# Patient Record
Sex: Male | Born: 1945 | Race: White | Hispanic: No | Marital: Married | Smoking: Never smoker
Health system: Southern US, Community
[De-identification: ages and names within clinical notes are randomized; demographics above are authoritative.]

## PROBLEM LIST (undated history)

## (undated) DIAGNOSIS — T7840XA Allergy, unspecified, initial encounter: Secondary | ICD-10-CM

## (undated) DIAGNOSIS — D696 Thrombocytopenia, unspecified: Secondary | ICD-10-CM

## (undated) DIAGNOSIS — L409 Psoriasis, unspecified: Secondary | ICD-10-CM

## (undated) DIAGNOSIS — E785 Hyperlipidemia, unspecified: Secondary | ICD-10-CM

## (undated) HISTORY — DX: Gilbert syndrome: E80.4

## (undated) HISTORY — DX: Hyperlipidemia, unspecified: E78.5

## (undated) HISTORY — PX: TONSILLECTOMY AND ADENOIDECTOMY: SUR1326

## (undated) HISTORY — DX: Psoriasis, unspecified: L40.9

## (undated) HISTORY — DX: Thrombocytopenia, unspecified: D69.6

## (undated) HISTORY — DX: Allergy, unspecified, initial encounter: T78.40XA

## (undated) HISTORY — PX: COLONOSCOPY: SHX174

---

## 2003-06-01 ENCOUNTER — Encounter: Payer: Self-pay | Admitting: Internal Medicine

## 2004-06-16 ENCOUNTER — Ambulatory Visit: Payer: Self-pay | Admitting: Internal Medicine

## 2004-06-23 ENCOUNTER — Ambulatory Visit: Payer: Self-pay | Admitting: Internal Medicine

## 2004-07-12 ENCOUNTER — Ambulatory Visit: Payer: Self-pay | Admitting: Internal Medicine

## 2005-01-18 ENCOUNTER — Ambulatory Visit: Payer: Self-pay | Admitting: Internal Medicine

## 2006-03-04 ENCOUNTER — Ambulatory Visit: Payer: Self-pay | Admitting: Internal Medicine

## 2006-03-04 ENCOUNTER — Encounter (INDEPENDENT_AMBULATORY_CARE_PROVIDER_SITE_OTHER): Payer: Self-pay | Admitting: *Deleted

## 2006-03-04 LAB — CONVERTED CEMR LAB: PSA: 3.31 ng/mL

## 2006-03-18 ENCOUNTER — Ambulatory Visit: Payer: Self-pay | Admitting: Internal Medicine

## 2006-07-03 ENCOUNTER — Ambulatory Visit: Payer: Self-pay | Admitting: Internal Medicine

## 2006-07-03 LAB — CONVERTED CEMR LAB
AST: 23 units/L (ref 0–37)
Cholesterol: 132 mg/dL (ref 0–200)
LDL Cholesterol: 80 mg/dL (ref 0–99)

## 2007-01-08 ENCOUNTER — Ambulatory Visit: Payer: Self-pay | Admitting: Internal Medicine

## 2007-01-10 ENCOUNTER — Ambulatory Visit: Payer: Self-pay | Admitting: Internal Medicine

## 2007-01-10 DIAGNOSIS — J45909 Unspecified asthma, uncomplicated: Secondary | ICD-10-CM | POA: Insufficient documentation

## 2007-01-10 DIAGNOSIS — E785 Hyperlipidemia, unspecified: Secondary | ICD-10-CM | POA: Insufficient documentation

## 2007-01-10 DIAGNOSIS — L408 Other psoriasis: Secondary | ICD-10-CM

## 2007-01-10 LAB — CONVERTED CEMR LAB
AST: 23 units/L (ref 0–37)
Cholesterol: 129 mg/dL (ref 0–200)
Total CHOL/HDL Ratio: 3.1

## 2007-01-14 ENCOUNTER — Ambulatory Visit: Payer: Self-pay | Admitting: Gastroenterology

## 2007-02-12 ENCOUNTER — Ambulatory Visit: Payer: Self-pay | Admitting: Gastroenterology

## 2007-02-21 ENCOUNTER — Encounter: Payer: Self-pay | Admitting: Internal Medicine

## 2007-02-21 ENCOUNTER — Ambulatory Visit: Payer: Self-pay | Admitting: Gastroenterology

## 2007-04-07 ENCOUNTER — Telehealth (INDEPENDENT_AMBULATORY_CARE_PROVIDER_SITE_OTHER): Payer: Self-pay | Admitting: *Deleted

## 2007-05-19 ENCOUNTER — Telehealth (INDEPENDENT_AMBULATORY_CARE_PROVIDER_SITE_OTHER): Payer: Self-pay | Admitting: *Deleted

## 2007-06-30 ENCOUNTER — Encounter (INDEPENDENT_AMBULATORY_CARE_PROVIDER_SITE_OTHER): Payer: Self-pay | Admitting: *Deleted

## 2007-07-02 ENCOUNTER — Ambulatory Visit: Payer: Self-pay | Admitting: Internal Medicine

## 2007-07-02 DIAGNOSIS — R7989 Other specified abnormal findings of blood chemistry: Secondary | ICD-10-CM | POA: Insufficient documentation

## 2007-07-02 DIAGNOSIS — J309 Allergic rhinitis, unspecified: Secondary | ICD-10-CM | POA: Insufficient documentation

## 2007-07-14 ENCOUNTER — Encounter (INDEPENDENT_AMBULATORY_CARE_PROVIDER_SITE_OTHER): Payer: Self-pay | Admitting: *Deleted

## 2007-07-31 ENCOUNTER — Ambulatory Visit: Payer: Self-pay | Admitting: Internal Medicine

## 2007-10-30 ENCOUNTER — Telehealth (INDEPENDENT_AMBULATORY_CARE_PROVIDER_SITE_OTHER): Payer: Self-pay | Admitting: *Deleted

## 2008-01-09 ENCOUNTER — Telehealth (INDEPENDENT_AMBULATORY_CARE_PROVIDER_SITE_OTHER): Payer: Self-pay | Admitting: *Deleted

## 2008-03-25 ENCOUNTER — Telehealth (INDEPENDENT_AMBULATORY_CARE_PROVIDER_SITE_OTHER): Payer: Self-pay | Admitting: *Deleted

## 2008-04-20 ENCOUNTER — Telehealth (INDEPENDENT_AMBULATORY_CARE_PROVIDER_SITE_OTHER): Payer: Self-pay | Admitting: *Deleted

## 2008-07-05 ENCOUNTER — Ambulatory Visit: Payer: Self-pay | Admitting: Internal Medicine

## 2008-07-05 DIAGNOSIS — N400 Enlarged prostate without lower urinary tract symptoms: Secondary | ICD-10-CM

## 2008-07-12 ENCOUNTER — Encounter (INDEPENDENT_AMBULATORY_CARE_PROVIDER_SITE_OTHER): Payer: Self-pay | Admitting: *Deleted

## 2008-07-16 ENCOUNTER — Telehealth (INDEPENDENT_AMBULATORY_CARE_PROVIDER_SITE_OTHER): Payer: Self-pay | Admitting: *Deleted

## 2008-12-23 ENCOUNTER — Ambulatory Visit: Payer: Self-pay | Admitting: Internal Medicine

## 2008-12-23 ENCOUNTER — Telehealth (INDEPENDENT_AMBULATORY_CARE_PROVIDER_SITE_OTHER): Payer: Self-pay | Admitting: *Deleted

## 2008-12-27 ENCOUNTER — Encounter: Payer: Self-pay | Admitting: Internal Medicine

## 2009-01-14 ENCOUNTER — Telehealth (INDEPENDENT_AMBULATORY_CARE_PROVIDER_SITE_OTHER): Payer: Self-pay | Admitting: *Deleted

## 2009-01-25 ENCOUNTER — Ambulatory Visit: Payer: Self-pay | Admitting: Internal Medicine

## 2009-01-25 LAB — CONVERTED CEMR LAB: LDL Goal: 115 mg/dL

## 2009-04-12 ENCOUNTER — Ambulatory Visit: Payer: Self-pay | Admitting: Internal Medicine

## 2009-04-20 LAB — CONVERTED CEMR LAB
Albumin: 4.4 g/dL (ref 3.5–5.2)
Cholesterol: 140 mg/dL (ref 0–200)
HDL: 42.7 mg/dL (ref >39.00)
Total CK: 59 units/L (ref 7–232)
Total Protein: 7.6 g/dL (ref 6.0–8.3)
Triglycerides: 61 mg/dL (ref 0.0–149.0)

## 2009-04-26 ENCOUNTER — Ambulatory Visit: Payer: Self-pay | Admitting: Internal Medicine

## 2009-06-04 HISTORY — PX: PROSTATE SURGERY: SHX751

## 2009-10-11 ENCOUNTER — Ambulatory Visit: Payer: Self-pay | Admitting: Internal Medicine

## 2009-10-11 DIAGNOSIS — R351 Nocturia: Secondary | ICD-10-CM

## 2009-10-11 DIAGNOSIS — Z9189 Other specified personal risk factors, not elsewhere classified: Secondary | ICD-10-CM

## 2009-10-19 ENCOUNTER — Telehealth (INDEPENDENT_AMBULATORY_CARE_PROVIDER_SITE_OTHER): Payer: Self-pay | Admitting: *Deleted

## 2009-10-19 DIAGNOSIS — R972 Elevated prostate specific antigen [PSA]: Secondary | ICD-10-CM | POA: Insufficient documentation

## 2009-11-21 ENCOUNTER — Encounter: Payer: Self-pay | Admitting: Internal Medicine

## 2009-12-15 ENCOUNTER — Encounter: Payer: Self-pay | Admitting: Internal Medicine

## 2010-07-02 LAB — CONVERTED CEMR LAB
ALT: 24 units/L (ref 0–53)
ALT: 30 units/L (ref 0–53)
AST: 25 units/L (ref 0–37)
AST: 26 units/L (ref 0–37)
Albumin: 4.5 g/dL (ref 3.5–5.2)
Alkaline Phosphatase: 48 units/L (ref 39–117)
BUN: 15 mg/dL (ref 6–23)
Basophils Absolute: 0 10*3/uL (ref 0.0–0.1)
Basophils Absolute: 0.1 10*3/uL (ref 0.0–0.1)
Basophils Relative: 1.2 % (ref 0.0–3.0)
Bilirubin, Direct: 0.3 mg/dL (ref 0.0–0.3)
CO2: 30 meq/L (ref 19–32)
Calcium: 9.5 mg/dL (ref 8.4–10.5)
Calcium: 9.7 mg/dL (ref 8.4–10.5)
Cholesterol: 150 mg/dL (ref 0–200)
Creatinine, Ser: 1.1 mg/dL (ref 0.4–1.5)
Eosinophils Absolute: 0.3 10*3/uL (ref 0.0–0.6)
Eosinophils Relative: 5.2 % — ABNORMAL HIGH (ref 0.0–5.0)
Eosinophils Relative: 5.5 % — ABNORMAL HIGH (ref 0.0–5.0)
Eosinophils Relative: 5.9 % — ABNORMAL HIGH (ref 0.0–5.0)
GFR calc Af Amer: 87 mL/min
GFR calc non Af Amer: 72 mL/min
GFR calc non Af Amer: 85.35 mL/min (ref >60)
Glucose, Bld: 100 mg/dL — ABNORMAL HIGH (ref 70–99)
Glucose, Bld: 92 mg/dL (ref 70–99)
Glucose, Bld: 97 mg/dL (ref 70–99)
HCT: 39.5 % (ref 39.0–52.0)
HCT: 41.3 % (ref 39.0–52.0)
HDL goal, serum: 40 mg/dL
HDL: 49 mg/dL (ref >39.00)
Hemoglobin: 13.8 g/dL (ref 13.0–17.0)
Hemoglobin: 14.5 g/dL (ref 13.0–17.0)
Hemoglobin: 14.6 g/dL (ref 13.0–17.0)
Hgb A1c MFr Bld: 5 % (ref 4.6–6.0)
LDL Cholesterol: 87 mg/dL (ref 0–99)
LDL Cholesterol: 94 mg/dL (ref 0–99)
LDL Goal: 160 mg/dL
Lymphocytes Relative: 18.6 % (ref 12.0–46.0)
Lymphocytes Relative: 18.8 % (ref 12.0–46.0)
Lymphocytes Relative: 20.4 % (ref 12.0–46.0)
Lymphs Abs: 1.1 10*3/uL (ref 0.7–4.0)
MCHC: 34.5 g/dL (ref 30.0–36.0)
MCV: 91.7 fL (ref 78.0–100.0)
MCV: 93.7 fL (ref 78.0–100.0)
Monocytes Absolute: 0.3 10*3/uL (ref 0.2–0.7)
Monocytes Relative: 9.2 % (ref 3.0–12.0)
Neutro Abs: 3 10*3/uL (ref 1.4–7.7)
Neutro Abs: 3.3 10*3/uL (ref 1.4–7.7)
Neutro Abs: 3.7 10*3/uL (ref 1.4–7.7)
Neutrophils Relative %: 69.6 % (ref 43.0–77.0)
PSA: 3.96 ng/mL (ref 0.10–4.00)
Platelets: 137 10*3/uL — ABNORMAL LOW (ref 150.0–400.0)
Potassium: 3.9 meq/L (ref 3.5–5.1)
RBC: 4.52 M/uL (ref 4.22–5.81)
RDW: 13.6 % (ref 11.5–14.6)
Sodium: 142 meq/L (ref 135–145)
Sodium: 144 meq/L (ref 135–145)
TSH: 1.35 microintl units/mL (ref 0.35–5.50)
Total Bilirubin: 1.1 mg/dL (ref 0.3–1.2)
Total Bilirubin: 1.3 mg/dL — ABNORMAL HIGH (ref 0.3–1.2)
Total Protein: 7.5 g/dL (ref 6.0–8.3)
Triglycerides: 47 mg/dL (ref 0–149)
VLDL: 15.6 mg/dL (ref 0.0–40.0)
WBC: 4.8 10*3/uL (ref 4.5–10.5)
WBC: 5 10*3/uL (ref 4.5–10.5)
WBC: 5.6 10*3/uL (ref 4.5–10.5)

## 2010-07-04 NOTE — Assessment & Plan Note (Signed)
Summary: emp//pt will be fasting//lch   Vital Signs:  Patient profile:   65 year old male Height:      71 inches Weight:      189.6 pounds BMI:     26.54 Temp:     98.5 degrees F oral Pulse rate:   64 / minute Resp:     14 per minute BP sitting:   112 / 80  (left arm) Cuff size:   large  Vitals Entered By: Shonna Chock (Oct 11, 2009 8:25 AM) CC: CPX with fasting labs and discuss fish oil (which type to take). Patient also has a question about Cortizone ointment vs cream Comments REVIEWED MED LIST, PATIENT AGREED DOSE AND INSTRUCTION CORRECT    CC:  CPX with fasting labs and discuss fish oil (which type to take). Patient also has a question about Cortizone ointment vs cream.  History of Present Illness: [name] is here for a wellness  physical through [facility]; he  is having occasional nocturia & he has active Psoriasis.Other risk factors & preventive health measures reviewed . All risks addressed but he does not take flu shot.See ROS , PMF , FH , Social History & physical exam  for documentation. He questions iron deficiency as he avoids red meat due to lipid issues.  Allergies (verified): No Known Drug Allergies  Past History:  Past Medical History: Asthma ,PMH of as child Hyperlipidemia: LDL 133(1566/296),TG 61,HDL 46; LDL goal = <115 TCP(decreased platelet count) 2006; psoriasis ; Gilbert's Syndrome; Allergic rhinitis  Past Surgical History: Tonsillectomy Colonoscopy negative (2003 & 2008)  Family History: Father: Parkinson's disease Mother: dementia, elevated  cholesterol                                               MGF : MI in 44s ; M uncle: shunt for ? hydocephalus; P aunt: HTN,DM; M uncle: Mental Health diagnosis  Social History: Diet: no red meat Retired Married Never Smoked Alcohol use-yes, rarely Regular exercise-yes; walking 4X/week 1.5 mpd  Review of Systems General:  Denies chills, fatigue, fever, loss of appetite, sleep disorder, sweats,  and weight loss. Eyes:  Denies blurring, double vision, and vision loss-both eyes. ENT:  Denies difficulty swallowing, hoarseness, nasal congestion, and sinus pressure. CV:  Denies chest pain or discomfort, difficulty breathing at night, difficulty breathing while lying down, leg cramps with exertion, lightheadness, near fainting, shortness of breath with exertion, swelling of feet, and swelling of hands. Resp:  Complains of excessive snoring; denies cough, hypersomnolence, morning headaches, and sputum productive; No apnea. No asthma symptoms. GI:  Denies abdominal pain, bloody stools, dark tarry stools, and indigestion. GU:  Denies discharge, dysuria, hematuria, and incontinence. MS:  Denies joint pain, joint redness, joint swelling, low back pain, mid back pain, and thoracic pain. Derm:  Complains of rash; denies changes in nail beds, dryness, and lesion(s). Neuro:  Denies brief paralysis, difficulty with concentration, disturbances in coordination, falling down, memory loss, numbness, poor balance, tingling, and weakness. Psych:  Denies anxiety, depression, easily angered, easily tearful, and irritability. Endo:  Denies cold intolerance, excessive hunger, excessive thirst, excessive urination, and heat intolerance. Heme:  Denies abnormal bruising and bleeding. Allergy:  Complains of itching eyes, seasonal allergies, and sneezing; Much improved ; generic Allegra as needed .  Physical Exam  General:  well-nourished; alert,appropriate and cooperative throughout examination Head:  Normocephalic and  atraumatic without obvious abnormalities.  Eyes:  No corneal or conjunctival inflammation noted.  Perrla. Funduscopic exam benign, without hemorrhages, exudates or papilledema. Vision grossly normal. Ears:  External ear exam shows no significant lesions or deformities.  Otoscopic examination reveals clear canals, tympanic membranes are intact bilaterally without bulging, retraction, inflammation or  discharge. Hearing is grossly normal bilaterally. Some wax bilaterally Nose:  External nasal examination shows no deformity or inflammation. Nasal mucosa are pink and moist without lesions or exudates. Mouth:  Oral mucosa and oropharynx without lesions or exudates.  Teeth in good repair. Neck:  No deformities, masses, or tenderness noted. Lungs:  Normal respiratory effort, chest expands symmetrically. Lungs are clear to auscultation, no crackles or wheezes. Heart:  Normal rate and regular rhythm. S1 and S2 normal without gallop, murmur, click, rub.S4 Abdomen:  Bowel sounds positive,abdomen soft and non-tender without masses, organomegaly. Ventral  hernia  noted. Rectal:  No external abnormalities noted. Normal sphincter tone. No rectal masses or tenderness. Genitalia:  Testes bilaterally descended without nodularity, tenderness or masses. No scrotal masses or lesions. No penis lesions or urethral discharge. Small  L varicocele.   Prostate:  Prostate gland firm and smooth, no enlargement, nodularity, tenderness, mass, asymmetry or induration. Msk:  No deformity or scoliosis noted of thoracic or lumbar spine.   Pulses:  R and L carotid,radial,dorsalis pedis and posterior tibial pulses are full and equal bilaterally Extremities:  No clubbing, cyanosis, edema, or deformity noted with normal full range of motion of all joints.   Neurologic:  alert & oriented X3, gait normal, and DTRs symmetrical and normal.   Skin:  Psoriatic patch L upper back Cervical Nodes:  No lymphadenopathy noted Axillary Nodes:  No palpable lymphadenopathy Inguinal Nodes:  No significant adenopathy Psych:  Oriented X3, memory intact for recent and remote, normally interactive, good eye contact, not anxious appearing, and not depressed appearing.     Impression & Recommendations:  Problem # 1:  PREVENTIVE HEALTH CARE (ICD-V70.0)  Orders: EKG w/ Interpretation (93000) Venipuncture (36644) TLB-Lipid Panel  (80061-LIPID) TLB-BMP (Basic Metabolic Panel-BMET) (80048-METABOL) TLB-CBC Platelet - w/Differential (85025-CBCD) TLB-Hepatic/Liver Function Pnl (80076-HEPATIC) TLB-TSH (Thyroid Stimulating Hormone) (84443-TSH) TLB-PSA (Prostate Specific Antigen) (84153-PSA)  Problem # 2:  NOCTURIA (ICD-788.43) intermittent ; decreased fluids pre  bedtime   Problem # 3:  SNORING, HX OF (ICD-V15.89) verify if apnea present; Sleep Study if symptoms progressive  Problem # 4:  HYPERPLASIA PROSTATE UNS W/O UR OBST & OTH LUTS (ICD-600.90) resolved  Problem # 5:  HYPERLIPIDEMIA (ICD-272.4)  His updated medication list for this problem includes:    Niacin Cr 1000 Mg Cr-tabs (Niacin) .Marland Kitchen... 1 by mouth once daily    Pravastatin Sodium 20 Mg Tabs (Pravastatin sodium) .Marland Kitchen... 1 at bedtime  Orders: EKG w/ Interpretation (93000)  Problem # 6:  ALLERGIC RHINITIS (ICD-477.9)  The following medications were removed from the medication list:    Allegra 180 Mg Tabs (Fexofenadine hcl) ..... Once daily as needed His updated medication list for this problem includes:    Loratadine 10 Mg Tabs (Loratadine) .Marland Kitchen... 1 once daily as needed allergies  Problem # 7:  PSORIASIS (ICD-696.1)  Complete Medication List: 1)  Folic Acid 1 Mg Tabs (Folic acid) .Marland Kitchen.. 1 by mouth qd 2)  Niacin Cr 1000 Mg Cr-tabs (Niacin) .Marland Kitchen.. 1 by mouth once daily 3)  Clobetasol Propionate 0.05 % Oint (clobetasol Propionate)  .... As needed 4)  Fish Oil 1200 Mg Caps (Omega-3 fatty acids) .... 2 qd 5)  Aspir-low 81 Mg  Tbec (Aspirin) .Marland Kitchen.. 1 by mouth once daily 6)  Cortizone-10 1 % Oint (Hydrocortisone) .... As needed 7)  Pravastatin Sodium 20 Mg Tabs (Pravastatin sodium) .Marland Kitchen.. 1 at bedtime 8)  Loratadine 10 Mg Tabs (Loratadine) .Marland Kitchen.. 1 once daily as needed allergies  Patient Instructions: 1)  Report  any apnea or severe snoring as per wife's observation as we discussed. Prescriptions: LORATADINE 10 MG TABS (LORATADINE) 1 once daily as needed allergies   #90 x 3   Entered and Authorized by:   Marga Melnick MD   Signed by:   Marga Melnick MD on 10/11/2009   Method used:   Print then Give to Patient   RxID:   [id number] CLOBETASOL PROPIONATE 0.05 %  OINT (CLOBETASOL PROPIONATE) as needed  #90 grams x 0   Entered and Authorized by:   Marga Melnick MD   Signed by:   Marga Melnick MD on 10/11/2009   Method used:   Historical   RxID:   [id number] PRAVASTATIN SODIUM 20 MG TABS (PRAVASTATIN SODIUM) 1 at bedtime  #90 x 3   Entered and Authorized by:   Marga Melnick MD   Signed by:   Marga Melnick MD on 10/11/2009   Method used:   Print then Give to Patient   RxID:   [id number]

## 2010-07-04 NOTE — Progress Notes (Signed)
Summary: referral  Phone Note Call from Patient   Caller: Patient Summary of Call: Pt called back re-lab result which indicated urology consult. Pt states that he does not have a preferred physician just who ever.Hopp suggest.ok to speak with wife. left message to call office to inform pt referral has been put in, awaiting appt info.............Marland KitchenFelecia Deloach CMA  Oct 19, 2009 11:50 AM   pt wife aware awaiting appt info...................Marland KitchenFelecia Deloach CMA  Oct 19, 2009 4:50 PM    New Problems: PSA, INCREASED (ICD-790.93)   New Problems: PSA, INCREASED (ICD-790.93)

## 2010-07-04 NOTE — Letter (Signed)
Summary: [facility]   Imported By: Lennie Odor 12/29/2009 09:22:09  _____________________________________________________________________  External Attachment:    Type:   Image     Comment:   External Document

## 2010-07-04 NOTE — Consult Note (Signed)
Summary: [facility]  [facility]   Imported By: Lanelle Bal 11/30/2009 10:01:00  _____________________________________________________________________  External Attachment:    Type:   Image     Comment:   External Document

## 2010-07-21 ENCOUNTER — Encounter: Payer: Self-pay | Admitting: Internal Medicine

## 2010-08-01 NOTE — Letter (Signed)
Summary: [facility]  [facility]   Imported By: Maryln Gottron 07/28/2010 14:02:31  _____________________________________________________________________  External Attachment:    Type:   Image     Comment:   External Document

## 2010-09-19 ENCOUNTER — Other Ambulatory Visit: Payer: Self-pay | Admitting: Internal Medicine

## 2010-10-12 ENCOUNTER — Encounter: Payer: Self-pay | Admitting: Internal Medicine

## 2010-10-13 ENCOUNTER — Other Ambulatory Visit: Payer: Self-pay | Admitting: Internal Medicine

## 2010-10-13 ENCOUNTER — Ambulatory Visit (INDEPENDENT_AMBULATORY_CARE_PROVIDER_SITE_OTHER): Payer: Medicare Other | Admitting: Internal Medicine

## 2010-10-13 ENCOUNTER — Encounter: Payer: Self-pay | Admitting: Internal Medicine

## 2010-10-13 DIAGNOSIS — J309 Allergic rhinitis, unspecified: Secondary | ICD-10-CM

## 2010-10-13 DIAGNOSIS — B079 Viral wart, unspecified: Secondary | ICD-10-CM

## 2010-10-13 DIAGNOSIS — R7309 Other abnormal glucose: Secondary | ICD-10-CM

## 2010-10-13 DIAGNOSIS — Z0100 Encounter for examination of eyes and vision without abnormal findings: Secondary | ICD-10-CM

## 2010-10-13 DIAGNOSIS — E785 Hyperlipidemia, unspecified: Secondary | ICD-10-CM

## 2010-10-13 DIAGNOSIS — Z0000 Encounter for general adult medical examination without abnormal findings: Secondary | ICD-10-CM

## 2010-10-13 DIAGNOSIS — R972 Elevated prostate specific antigen [PSA]: Secondary | ICD-10-CM

## 2010-10-13 DIAGNOSIS — D696 Thrombocytopenia, unspecified: Secondary | ICD-10-CM

## 2010-10-13 DIAGNOSIS — Z23 Encounter for immunization: Secondary | ICD-10-CM

## 2010-10-13 LAB — LIPID PANEL
HDL: 45.1 mg/dL (ref >39.00)
LDL Cholesterol: 94 mg/dL (ref 0–99)
Total CHOL/HDL Ratio: 3
Triglycerides: 61 mg/dL (ref 0.0–149.0)

## 2010-10-13 LAB — CBC WITH DIFFERENTIAL/PLATELET
Basophils Relative: 0.6 % (ref 0.0–3.0)
Eosinophils Relative: 5.8 % — ABNORMAL HIGH (ref 0.0–5.0)
Hemoglobin: 14.1 g/dL (ref 13.0–17.0)
MCV: 93.5 fl (ref 78.0–100.0)
Monocytes Absolute: 0.5 10*3/uL (ref 0.1–1.0)
Neutrophils Relative %: 57.1 % (ref 43.0–77.0)
RBC: 4.33 Mil/uL (ref 4.22–5.81)
WBC: 4.2 10*3/uL — ABNORMAL LOW (ref 4.5–10.5)

## 2010-10-13 LAB — HEPATIC FUNCTION PANEL
Albumin: 4.5 g/dL (ref 3.5–5.2)
Bilirubin, Direct: 0.2 mg/dL (ref 0.0–0.3)
Total Protein: 7.3 g/dL (ref 6.0–8.3)

## 2010-10-13 LAB — BASIC METABOLIC PANEL
Chloride: 103 mEq/L (ref 96–112)
Potassium: 4 mEq/L (ref 3.5–5.1)

## 2010-10-13 MED ORDER — PNEUMOCOCCAL VAC POLYVALENT 25 MCG/0.5ML IJ INJ
0.5000 mL | INJECTION | Freq: Once | INTRAMUSCULAR | Status: AC
  Administered 2010-10-13: 0.5 mL via INTRAMUSCULAR

## 2010-10-13 MED ORDER — FLUTICASONE PROPIONATE 50 MCG/ACT NA SUSP: 1.0000 | Freq: Two times a day (BID) | NASAL | Status: AC | PRN

## 2010-10-13 NOTE — Assessment & Plan Note (Signed)
NMR Lipoprofile 2004: LDL 161(0960/454), HDL 30, TG 95. LDL goal = < 120. PGF MI > 55.

## 2010-10-13 NOTE — Progress Notes (Signed)
Subjective:    Patient ID: Antonio Steele, male    DOB: September 17, 1945, 65 y.o.   MRN: [id number]  HPI Medicare Wellness Visit:  The following psychosocial & medical history were reviewed as required by Medicare.   Social history: caffeine: 2 cups/ day , alcohol:  no ,  tobacco use : 1962( smoked 6 mos)  & exercise : walking / golf 3- 5 X/ week.   Home & personal  safety / fall risk: no issues, activities of daily living: no limitations , seatbelt use : yes , and smoke alarm employment :  yes .  Power of Attorney/Living Will status : yes  Vision ( as recorded per Nurse) & Hearing  evaluation :  Whisper heard @ 6 ft. Orientation :oriented x3 , memory & recall :good, spelling  good,and mood & affect :  normal . Depression / anxiety: denied Travel history : 72s [place] , immunization status :Pneumovax given , transfusion history:  no, and preventive health surveillance ( colonoscopies, BMD , etc as per protocol/ SOC): up to date, Dental care:  Sen every 6 mos . Chart reviewed &  Updated. Active issues reviewed & addressed.       Review of Systems Patient reports no  vision/ hearing changes,anorexia, weight change, fever ,adenopathy, persistant / recurrent hoarseness, swallowing issues, chest pain,palpitations, edema,persistant / recurrent cough, hemoptysis, dyspnea(rest, exertional, paroxysmal nocturnal), gastrointestinal  bleeding (melena, rectal bleeding), abdominal pain, excessive heart burn, GU symptoms( dysuria, hematuria, pyuria, voiding/incontinence  issues) syncope, focal weakness, memory loss,numbness & tingling, hair/nail changes, abnormal bruising/bleeding, musculoskeletal symptoms/signs.  Psoriasis controlled with meds. Seasonal allergies responsive to Allegra , not controlled with Loratidine.     Objective:   Physical Exam Gen.: Healthy and well-nourished in appearance. Alert, appropriate and cooperative throughout exam. Head: Normocephalic without obvious abnormalities;   Pattern   alopecia  Eyes: No corneal or conjunctival inflammation noted. Pupils equal round reactive to light and accommodation. Fundal exam is benign without hemorrhages, exudate, papilledema. Extraocular motion intact. Wart like growth OD upper lid Ears: External  ear exam reveals no significant lesions or deformities. Canals:wax bilaterally , but hearing is grossly normal bilaterally. Nose: External nasal exam reveals no deformity or inflammation. Nasal mucosa are pink and moist. No lesions or exudates noted. Septum  slight diuslocation  Mouth: Oral mucosa and oropharynx reveal no lesions or exudates. Teeth in good repair. Neck: No deformities, masses, or tenderness noted. Range of motion  normal. Thyroid   No nodules. Lungs: Normal respiratory effort; chest expands symmetrically. Lungs are clear to auscultation without rales, wheezes, or increased work of breathing. Heart: Normal rate and rhythm. Normal S1 and S2. No gallop, click, or rub.  S4 w/o  murmur. Abdomen: Bowel sounds normal; abdomen soft and nontender. No masses, organomegaly or hernias noted.Genitalia: Dr Retta Diones monitoring PSA  & DRE( seen every 6 months).                                         Musculoskeletal/extremities: No deformity or scoliosis noted of  the thoracic or lumbar spine. No clubbing, cyanosis, edema, or deformity noted. Range of motion  normal .Tone & strength  normal.Joints normal. Nail health  good. Vascular: Carotid, radial artery, dorsalis pedis and dorsalis posterior tibial pulses are full and equal. No bruits present. Neurologic: Alert and oriented x3. Deep tendon reflexes symmetrical and normal.  Skin: Intact without suspicious lesions or rashes. see  Eye; scattered keratoses Lymph: No cervical, axillary, or inguinal lymphadenopathy present. Psych: Mood and affect are normal. Normally interactive                                                                                         Assessment & Plan:  #1  Medicare wellness visit; criteria met and data  entered  #2 seasonal allergies; continue Allegra. Fluticasone will be added.  #3 dyslipidemia  Plan: See orders and recommendations.

## 2010-10-13 NOTE — Assessment & Plan Note (Signed)
PSA 8.32 on 07/17/2010; Dr Retta Diones

## 2010-10-13 NOTE — Patient Instructions (Signed)
Preventive Health Care: Exercise at least 30-45 minutes a day,  3-4 days a week.  Eat a low-fat diet with lots of fruits and vegetables, up to 7-9 servings per day. Avoid obesity; your goal is waist measurement < 40 inches.Consume less than 40 grams of sugar per day from foods & drinks with High Fructose Corn Sugar as #2,3 or # 4 on label. Use Allegra & Neti pot daily as needed.Generic  Flonase daily as Rxed .

## 2010-10-15 ENCOUNTER — Encounter: Payer: Self-pay | Admitting: Internal Medicine

## 2010-10-17 NOTE — Letter (Signed)
February 12, 2007    Titus Dubin. Alwyn Ren, MD,FACP,FCCP  [phone] [place], [place] [address]   RE:  Antonio Steele, Antonio Steele  MRN:  [id number]  /  DOB:  11-Dec-1945   Dear Dr. Alwyn Ren:   Upon your kind referral, I had the pleasure of evaluating your patient  and I am pleased to offer my findings.  I saw Antonio Steele in the  office today.  Enclosed is a copy of my progress note that details my  findings and recommendations.   Thank you for the opportunity to participate in your patient's care.    Sincerely,      Barbette Hair. Arlyce Dice, MD,FACG  Electronically Signed    RDK/MedQ  DD: 02/12/2007  DT: 02/13/2007  Job #: [id number]

## 2010-10-17 NOTE — Letter (Signed)
January 10, 2007    Barbette Hair. Arlyce Dice, MD,FACG  [address]  [place], [place] [address]   RE:  Antonio Steele, Antonio Steele  MRN:  [id number]  /  DOB:  1945/06/27   Dear Cleone Slim:   I saw Antonio Steele on January 10, 2007, to evaluate rectal bleeding.  This has  been present intermittently for several months and has been essentially  painless.  He denies any constitutional symptoms of fevers, chills,  sweats, or weight loss.  He does have psoriasis and questions whether  perirectal involvement  may be related to the anal bleeding.  He has had  some blood on the tissue but also blood unrelated at times with bowel  movements.  He denies any epistaxis, hemoptysis, hematuria, difficulty  clotting, or other bleeding dyscrasias.   His last colonoscopy was in 2003, and was negative.  He states that he  was recently told that he is not due until 2013.   On exam he has no organomegaly or tenderness.  Bowel sounds are normal.  There is some mild dermatitis of the buttocks but no significant  perianal dermatitis .  Hemoccult testing was negative.   In view of the intermittent bleeding over several months, would you  please evaluate Antonio Steele for repeat colonoscopy at this time.   His other issue, dyslipidemia, is well controlled with low  cardiovascular risk @ the present lipid values.    Sincerely,      Titus Dubin. Alwyn Ren, MD,FACP,FCCP  Electronically Signed    WFH/MedQ  DD: 01/10/2007  DT: 01/10/2007  Job #: [id number]

## 2010-10-17 NOTE — Assessment & Plan Note (Signed)
[facility]                         GASTROENTEROLOGY OFFICE NOTE   NAME:Antonio Steele, Antonio Steele                      MRN:          [id number]  DATE:02/12/2007                            DOB:          November 11, 1945    PROBLEM:  Rectal bleeding.   Antonio Steele is a pleasant 65 year old white male referred through the  courtesy of Dr. Alwyn Ren for evaluation.  He has been bothered by rectal  bleeding consisting of bright red blood on the toilet paper.  He denies  rectal pain, abdominal pain, change of bowel habits or melena.  At times  he has seen blood in the water as well.  He has psoriasis and has  questioned whether psoriasis involving the perianal area could be a  cause for bleeding.  Last colonoscopy in 2003 demonstrated few left-  sided diverticula.   PAST MEDICAL HISTORY:  Unremarkable except for psoriasis.   FAMILY HISTORY:  Noncontributory.   MEDICATIONS:  Folate, Niaspan ER, clobetasol cream, Lipitor and baby  aspirin.   He has no allergies.   He neither smokes nor drinks.  He is married and retired.   REVIEW OF SYSTEMS:  Otherwise negative.   PHYSICAL EXAMINATION:  Pulse 88, blood pressure 138/84, weight 181.  HEENT: EOMI.  PERRLA.  Sclerae are anicteric.  Conjunctivae are pink.  NECK:  Supple without thyromegaly, adenopathy or carotid bruits.  CHEST:  Clear to auscultation and percussion without adventitious  sounds.  CARDIAC:  Regular rhythm; normal S1 S2.  There are no murmurs, gallops  or rubs.  ABDOMEN:  Bowel sounds are normoactive.  Abdomen is soft, nontender and  nondistended.  There are no abdominal masses, tenderness, splenic  enlargement or hepatomegaly.  EXTREMITIES:  Full range of motion.  No cyanosis, clubbing or edema.  RECTAL:  He has psoriatic rash on his lower extremities and the perianal  area.  There are no gross rectal lesions.   IMPRESSION:  Limited rectal bleeding.  This could be due to hemorrhoidal  bleeding though a  more proximal colonic bleeding source ought to be  ruled out.  I think it is unlikely that he is bleeding from the  psoriasis.   RECOMMENDATION:  Colonoscopy.     Barbette Hair. Arlyce Dice, MD,FACG  Electronically Signed    RDK/MedQ  DD: 02/12/2007  DT: 02/13/2007  Job #: [id number]   cc:   Titus Dubin. Alwyn Ren, MD,FACP,FCCP

## 2010-12-18 ENCOUNTER — Other Ambulatory Visit: Payer: Self-pay | Admitting: Internal Medicine

## 2011-08-21 ENCOUNTER — Telehealth: Payer: Self-pay | Admitting: Internal Medicine

## 2011-08-21 NOTE — Telephone Encounter (Signed)
Caller: [employer]/Patient; PCP: Marga Melnick; CB#: [phone]; ; ; Call regarding Post-Constipation Question;  occurred last week while travelling; states had a 15 hour car trip.  Did take a stool softener/dulcolax 2 tabs q hs and he did produce a large stool, and now BM is normal in character/consistency.  Per protocol, emergent symptoms denied; home care measures advised, with callback parameters given.

## 2011-08-21 NOTE — Telephone Encounter (Signed)
[facility] Triage Call Report Triage Record Num: [id number] Operator: Chevis Pretty Patient Name: Antonio Steele Call Date & Time: 08/21/2011 4:21:00PM Patient Phone: [phone] PCP: Marga Melnick Patient Gender: Male PCP Fax : [phone] Patient DOB: 1945-08-13 Practice Name: [facility] Reason for Call: Caller: [employer]/Patient; PCP: Marga Melnick; CB#: [phone]; ; ; Call regarding Post-Constipation Question; occurred last week while travelling; states had a 15 hour car trip. Did take a stool softener/dulcolax 2 tabs q hs and he did produce a large stool, and now BM is normal in character/consistency. Per protocol, emergent symptoms denied; home care measures advised, with callback parameters given. Protocol(s) Used: Constipation Recommended Outcome per Protocol: Provide Home/Self Care Reason for Outcome: Related to change in diet, decreased activity, or increased stress Care Advice: ~ Call provider if symptoms do not improve or symptoms worsen after trying home care measures. ~ Decrease intake of binding foods such as cheese, white rice, meats, and bananas. Natural laxatives include figs, prunes, dates, raisins and black licorice. A large serving of these fruits or a glass of prune juice with breakfast each morning for several days may help. ~ Speak with provider during regular office hours to review medication(s). Many medications (iron supplements, antidepressants, diuretics, antacids containing calcium) can contribute to constipation. ~ ~ SYMPTOM / CONDITION MANAGEMENT ~ CAUTIONS ~ Try to keep a regular time for eating your meals each day. Do not skip meals. Constipation Care Measures: - Drink 8 to 10 glasses of liquid per day, more if breastfeeding. - Drink warm water or coffee early in the morning. - Gradually increase dietary fiber (fresh fruits/vegetables, whole grain bread and cereals). - As tolerated, walk 30 minutes at a steady pace  daily. - Consider nonprescription stool softeners (Colace) per label, pharmacist or provider recommendations. Stool softners are not habit forming as some stimulant laxatives may become. - Consider nonprescription bulk forming laxatives (such as Metamucil, FiberCon, Citrucel, etc.); follow package directions. - Avoid routine use of strong laxatives/enemas/suppositories unless ordered by provider. - Do not delay having bowel movement when having urge. - Keep a routine; attempt bowel movement within half hour after a meal or after some exercise. ~

## 2011-08-21 NOTE — Telephone Encounter (Signed)
Noted, will send to Dr.Hopper Lorain Childes)

## 2011-10-16 ENCOUNTER — Other Ambulatory Visit: Payer: Self-pay | Admitting: Internal Medicine

## 2011-10-23 ENCOUNTER — Encounter: Payer: Self-pay | Admitting: Internal Medicine

## 2011-10-23 ENCOUNTER — Ambulatory Visit (INDEPENDENT_AMBULATORY_CARE_PROVIDER_SITE_OTHER): Payer: Medicare Other | Admitting: Internal Medicine

## 2011-10-23 VITALS — BP 136/80 | HR 71 | Temp 97.9°F | Resp 12 | Ht 71.5 in | Wt 196.6 lb

## 2011-10-23 DIAGNOSIS — M25541 Pain in joints of right hand: Secondary | ICD-10-CM

## 2011-10-23 DIAGNOSIS — M25549 Pain in joints of unspecified hand: Secondary | ICD-10-CM

## 2011-10-23 DIAGNOSIS — E785 Hyperlipidemia, unspecified: Secondary | ICD-10-CM

## 2011-10-23 DIAGNOSIS — Z8719 Personal history of other diseases of the digestive system: Secondary | ICD-10-CM

## 2011-10-23 DIAGNOSIS — Z0000 Encounter for general adult medical examination without abnormal findings: Secondary | ICD-10-CM

## 2011-10-23 LAB — CBC WITH DIFFERENTIAL/PLATELET
Eosinophils Absolute: 0.2 10*3/uL (ref 0.0–0.7)
Lymphs Abs: 1 10*3/uL (ref 0.7–4.0)
MCHC: 34.3 g/dL (ref 30.0–36.0)
MCV: 92.9 fl (ref 78.0–100.0)
Monocytes Absolute: 0.4 10*3/uL (ref 0.1–1.0)
Neutrophils Relative %: 67.1 % (ref 43.0–77.0)
Platelets: 123 10*3/uL — ABNORMAL LOW (ref 150.0–400.0)

## 2011-10-23 MED ORDER — CLOBETASOL PROPIONATE 0.05 % EX OINT: 1.0000 "application " | TOPICAL_OINTMENT | Freq: Two times a day (BID) | CUTANEOUS | Status: DC

## 2011-10-23 NOTE — Progress Notes (Signed)
Subjective:    Patient ID: Antonio Steele, male    DOB: Dec 22, 1945, 66 y.o.   MRN: [id number]  HPI Medicare Wellness Visit:  The following psychosocial & medical history were reviewed as required by Medicare.   Social history: caffeine: 1 cup coffee/ day , alcohol:  rarely ,  tobacco use : never  & exercise : walking 1-2 mpd 3-4X/ week & golf.   Home & personal  safety / fall risk: no issues, activities of daily living: no , seatbelt use : yes , and smoke alarm employment : yes .  Power of Attorney/Living Will status : in place  Vision ( as recorded per Nurse) & Hearing  evaluation :  Ophth 12/12; see exam. Orientation :oriented x 3 , memory & recall :good,  math testing: good,and mood & affect : normal . Depression / anxiety: denied  Travel history : [place] , immunization status : no Flu shot , transfusion history:  no, and preventive health surveillance ( colonoscopies, BMD , etc as per protocol/ [facility]): colonoscopy due 2018, Dental care: every 6 mos . Chart reviewed &  Updated. Active issues reviewed & addressed.       Review of Systems In March of this year after driving for 15 hours he noted rectal bleeding over a five-day period. This resolved spontaneously. His last colonoscopy was in 2008 and was negative. He denies abdominal pain, unexplained weight loss, melena, severe heartburn, or dysphasia.  Recently he has noted stiffness and pain in the 2nd &  third right fingers in the morning upon awakening. This improves through the day without definitive treatment     Objective:   Physical Exam Gen.: Healthy and well-nourished in appearance. Alert, appropriate and cooperative throughout exam. Head: Normocephalic without obvious abnormalities; no alopecia  Eyes: No corneal or conjunctival inflammation noted. Pupils equal round reactive to light and accommodation. Extraocular motion intact. Vision grossly normal. Ears: External  ear exam reveals no significant lesions or deformities.  Canals clear .TMs normal. Hearing is grossly normal bilaterally. Nose: External nasal exam reveals no deformity or inflammation. Nasal mucosa are pink and moist. No lesions or exudates noted. Mouth: Oral mucosa and oropharynx reveal no lesions or exudates. Teeth in good repair. Neck: No deformities, masses, or tenderness noted. Range of motion & Thyroid normal. Lungs: Normal respiratory effort; chest expands symmetrically. Lungs are clear to auscultation without rales, wheezes, or increased work of breathing. Heart: Normal rate and rhythm. Normal S1 and S2. No gallop, click, or rub. S4 w/o  murmur. Abdomen: Bowel sounds normal; abdomen soft and nontender. No masses, organomegaly or hernias noted. Genitalia: Dr Retta Diones                                         Musculoskeletal/extremities: No deformity or scoliosis noted of  the thoracic or lumbar spine;but there is some asymmetry of the posterior thoracic musculature suggesting occult scoliosis.  No clubbing, cyanosis, edema, or deformity noted. Range of motion  normal .Tone & strength  normal.Joints normal. Nail health  good. Vascular: Carotid, radial artery, dorsalis pedis and  posterior tibial pulses are full and equal. No bruits present. Neurologic: Alert and oriented x3. Deep tendon reflexes symmetrical and normal.          Skin: Intact without suspicious lesions or rashes. Lymph: No cervical, axillary lymphadenopathy present. Psych: Mood and affect are normal. Normally interactive  Assessment & Plan:  #1 Medicare Wellness Exam; criteria met ; data entered #2 Problem List reviewed ; Assessment/ Recommendations made  #3 rectal bleeding over 5 days in March. It has been 5 years since his last colonoscopy which was negative. GI referral to consider possible flexible sigmoidoscopy will be pursued  #4 minor degenerative symptoms  in hands; see  recommendations Plan: see Orders

## 2011-10-23 NOTE — Patient Instructions (Addendum)
Preventive Health Care: Exercise at least 30-45 minutes a day,  3-4 days a week.  Eat a low-fat diet with lots of fruits and vegetables, up to 7-9 servings per day. Consume less than 40 grams of sugar per day from foods & drinks with High Fructose Corn Sugar as # 1,2,3 or # 4 on label. Use an anti-inflammatory cream such as Aspercreme or Zostrix cream twice a day to the hand as needed. In lieu of this warm moist compresses or  hot water bottle can be used. Do not apply ice . To prevent palpitations or premature beats, avoid stimulants such as decongestants, diet pills, nicotine, or caffeine (coffee, tea, cola, or chocolate) to excess.  Please try to go on My Chart within the next 24 hours to allow me to release the results directly to you.

## 2011-10-24 LAB — LIPID PANEL
Cholesterol: 146 mg/dL (ref 0–200)
HDL: 45.2 mg/dL (ref >39.00)
LDL Cholesterol: 89 mg/dL (ref 0–99)
Total CHOL/HDL Ratio: 3
Triglycerides: 60 mg/dL (ref 0.0–149.0)

## 2011-10-24 LAB — HEPATIC FUNCTION PANEL
Bilirubin, Direct: 0.2 mg/dL (ref 0.0–0.3)
Total Bilirubin: 1.1 mg/dL (ref 0.3–1.2)
Total Protein: 7.5 g/dL (ref 6.0–8.3)

## 2011-10-24 LAB — BASIC METABOLIC PANEL
BUN: 14 mg/dL (ref 6–23)
CO2: 30 mEq/L (ref 19–32)
Chloride: 106 mEq/L (ref 96–112)
Creatinine, Ser: 1.1 mg/dL (ref 0.4–1.5)
Glucose, Bld: 83 mg/dL (ref 70–99)

## 2011-10-30 ENCOUNTER — Encounter: Payer: Self-pay | Admitting: Internal Medicine

## 2011-11-23 ENCOUNTER — Encounter: Payer: Self-pay | Admitting: Gastroenterology

## 2011-11-23 ENCOUNTER — Ambulatory Visit (INDEPENDENT_AMBULATORY_CARE_PROVIDER_SITE_OTHER): Payer: Medicare Other | Admitting: Gastroenterology

## 2011-11-23 VITALS — BP 120/70 | HR 92 | Ht 71.0 in | Wt 200.6 lb

## 2011-11-23 DIAGNOSIS — K625 Hemorrhage of anus and rectum: Secondary | ICD-10-CM

## 2011-11-23 NOTE — Assessment & Plan Note (Signed)
Limited rectal bleeding is most likely due to hemorrhoids. He underwent colonoscopy in 2008 for similar symptoms.  Recommendations #1 hemorrhoidal suppositories #2 should bleeding recur  I would consider sigmoidoscopy

## 2011-11-23 NOTE — Patient Instructions (Addendum)

## 2011-11-23 NOTE — Progress Notes (Signed)
History of Present Illness: Antonio Steele is a pleasant 66 year old white male referred at the request of Dr. Alwyn Ren for evaluation of rectal bleeding. While traveling to [place] he developed limited bright red blood per rectum consisting of blood in the toilet water. This lasted for several days. He was constipated at that time and passing hard stools. Since returning home he's had no recurrences.  Hemoglobin one month ago was 13.8. He underwent colonoscopy in 2008 because of limited hematochezia. Diverticula were seen. He denies change of bowel habits, abdominal or rectal pain.    Past Medical History  Diagnosis Date  . Psoriasis   . Hyperlipidemia     LDL goal = less than 120  . Allergy     Seasonal  . Asthma     Childhood asthma  . Gilbert syndrome   . Thrombocytopenia     Mild, intermittent   Past Surgical History  Procedure Date  . Colonoscopy     Negative x2  . Tonsillectomy and adenoidectomy   . Prostate surgery 2011    Needle biopsy x1   family history includes Dementia in his mother; Diabetes in his paternal aunt; Heart attack in his maternal grandfather; Hydrocephalus in his maternal uncle; Hyperlipidemia in his mother; Hypertension in his paternal aunt; Mental illness in his maternal uncle; and Parkinsonism in his father. Current Outpatient Prescriptions  Medication Sig Dispense Refill  . aspirin 81 MG tablet Take 81 mg by mouth daily.        . clobetasol ointment (TEMOVATE) 0.05 % Apply 1 application topically 2 (two) times daily.  30 g  5  . fexofenadine (ALLEGRA) 180 MG tablet Take 180 mg by mouth as needed.       . folic acid (FOLVITE) 1 MG tablet TAKE ONE TABLET BY MOUTH ONE TIME DAILY  90 tablet  3  . hydrocortisone 1 % ointment Apply 1 application topically as needed.        . niacin (NIASPAN) 1000 MG CR tablet Take 1,000 mg by mouth at bedtime.        . Omega-3 Fatty Acids (FISH OIL) 1200 MG CAPS Take by mouth as directed. 2 by mouth daily        . pravastatin  (PRAVACHOL) 20 MG tablet TAKE ONE TABLET BY MOUTH ONE TIME DAILY  90 tablet  0   Allergies as of 11/23/2011  . (No Known Allergies)    reports that he has never smoked. He has never used smokeless tobacco. He reports that he drinks alcohol. He reports that he does not use illicit drugs.     Review of Systems: Pertinent positive and negative review of systems were noted in the above HPI section. All other review of systems were otherwise negative.  Vital signs were reviewed in today's medical record Physical Exam: General: Well developed , well nourished, no acute distress Head: Normocephalic and atraumatic Eyes:  sclerae anicteric, EOMI Ears: Normal auditory acuity Mouth: No deformity or lesions Neck: Supple, no masses or thyromegaly Lungs: Clear throughout to auscultation Heart: Regular rate and rhythm; no murmurs, rubs or bruits Abdomen: Soft, non tender and non distended. No masses, hepatosplenomegaly or hernias noted. Normal Bowel sounds Rectal: There are no rectal masses. Stools Hemoccult negative Musculoskeletal: Symmetrical with no gross deformities  Skin: No lesions on visible extremities Pulses:  Normal pulses noted Extremities: No clubbing, cyanosis, edema or deformities noted Neurological: Alert oriented x 4, grossly nonfocal Cervical Nodes:  No significant cervical adenopathy Inguinal Nodes: No significant inguinal  adenopathy Psychological:  Alert and cooperative. Normal mood and affect

## 2011-12-16 ENCOUNTER — Other Ambulatory Visit: Payer: Self-pay | Admitting: Internal Medicine

## 2012-01-03 ENCOUNTER — Encounter: Payer: Self-pay | Admitting: Gastroenterology

## 2012-01-12 ENCOUNTER — Other Ambulatory Visit: Payer: Self-pay | Admitting: Internal Medicine

## 2012-04-01 ENCOUNTER — Encounter: Payer: Self-pay | Admitting: Internal Medicine

## 2012-06-12 ENCOUNTER — Encounter: Payer: Self-pay | Admitting: Internal Medicine

## 2012-06-12 DIAGNOSIS — T887XXA Unspecified adverse effect of drug or medicament, initial encounter: Secondary | ICD-10-CM

## 2012-06-12 DIAGNOSIS — E785 Hyperlipidemia, unspecified: Secondary | ICD-10-CM

## 2012-06-19 ENCOUNTER — Other Ambulatory Visit (INDEPENDENT_AMBULATORY_CARE_PROVIDER_SITE_OTHER): Payer: Medicare Other

## 2012-06-19 DIAGNOSIS — T887XXA Unspecified adverse effect of drug or medicament, initial encounter: Secondary | ICD-10-CM

## 2012-06-19 DIAGNOSIS — E785 Hyperlipidemia, unspecified: Secondary | ICD-10-CM

## 2012-06-19 LAB — AST: AST: 21 U/L (ref 0–37)

## 2012-06-19 LAB — LIPID PANEL
Cholesterol: 104 mg/dL (ref 0–200)
Triglycerides: 43 mg/dL (ref 0.0–149.0)

## 2012-06-19 LAB — CK: Total CK: 53 U/L (ref 7–232)

## 2012-07-19 ENCOUNTER — Other Ambulatory Visit: Payer: Self-pay

## 2012-09-12 ENCOUNTER — Other Ambulatory Visit: Payer: Self-pay | Admitting: Internal Medicine

## 2012-09-13 ENCOUNTER — Other Ambulatory Visit: Payer: Self-pay | Admitting: Internal Medicine

## 2012-11-03 ENCOUNTER — Encounter: Payer: Self-pay | Admitting: Internal Medicine

## 2012-11-04 ENCOUNTER — Telehealth: Payer: Self-pay | Admitting: Internal Medicine

## 2012-11-04 ENCOUNTER — Ambulatory Visit (INDEPENDENT_AMBULATORY_CARE_PROVIDER_SITE_OTHER): Payer: Medicare Other | Admitting: Family Medicine

## 2012-11-04 ENCOUNTER — Encounter: Payer: Self-pay | Admitting: Family Medicine

## 2012-11-04 VITALS — BP 142/68 | HR 77 | Temp 98.8°F | Wt 172.6 lb

## 2012-11-04 DIAGNOSIS — J069 Acute upper respiratory infection, unspecified: Secondary | ICD-10-CM

## 2012-11-04 MED ORDER — CEFUROXIME AXETIL 500 MG PO TABS: 500.0000 mg | ORAL_TABLET | Freq: Two times a day (BID) | ORAL | Status: AC

## 2012-11-04 MED ORDER — MOMETASONE FUROATE 50 MCG/ACT NA SUSP: 2.0000 | Freq: Every day | NASAL | Status: DC

## 2012-11-04 NOTE — Telephone Encounter (Signed)
Noted as per MD protocol ok to close encounter Pt with pending appt with Dr Laury Axon today due to no opening in [place] schedule.

## 2012-11-04 NOTE — Telephone Encounter (Signed)
Patient Information:  Caller Name: Josejulian  Phone: [phone]  Patient: Antonio Steele, Antonio Steele  Gender: Male  DOB: [birth year]  Age: 67 Years  PCP: Marga Melnick  Office Follow Up:  Does the office need to follow up with this patient?: No  Instructions For The Office: N/A  RN Note:  No color to drainage.  Symptoms  Reason For Call & Symptoms: dripping in the back of throat, sore throat and headache.  Symptoms worse in the am.  Reviewed Health History In EMR: Yes  Reviewed Medications In EMR: Yes  Reviewed Allergies In EMR: Yes  Reviewed Surgeries / Procedures: Yes  Date of Onset of Symptoms: 10/28/2012  Treatments Tried: Allegra D  Treatments Tried Worked: No  Guideline(s) Used:  Hay Fever - Nasal Allergies  Disposition Per Guideline:   See Today or Tomorrow in Office  Reason For Disposition Reached:   Patient wants to be seen  Advice Given:  N/A  Patient Will Follow Care Advice:  YES  Appointment Scheduled:  11/04/2012 14:30:00 Appointment Scheduled Provider:  Lelon Perla.  (all of Dr Hopper's appt were full)

## 2012-11-04 NOTE — Patient Instructions (Addendum)

## 2012-11-06 ENCOUNTER — Encounter: Payer: Self-pay | Admitting: Family Medicine

## 2012-11-06 NOTE — Progress Notes (Signed)
  Subjective:     Antonio Steele is a 67 y.o. male who presents for evaluation of symptoms of a URI. Symptoms include congestion, nasal congestion, no  fever, post nasal drip and sinus pressure. Onset of symptoms was 1 week ago, and has been gradually worsening since that time. Treatment to date: none.  The following portions of the patient's history were reviewed and updated as appropriate: allergies, current medications, past family history, past medical history, past social history, past surgical history and problem list.  Review of Systems Pertinent items are noted in HPI.   Objective:    BP 142/68  Pulse 77  Temp(Src) 98.8 F (37.1 C) (Oral)  Wt 172 lb 9.6 oz (78.291 kg)  BMI 24.08 kg/m2  SpO2 97% General appearance: alert, cooperative, appears stated age and no distress Ears: normal TM's and external ear canals both ears Nose: clear discharge, mild congestion, turbinates red, swollen, no sinus tenderness Throat: lips, mucosa, and tongue normal; teeth and gums normal Neck: no adenopathy, supple, symmetrical, trachea midline and thyroid not enlarged, symmetric, no tenderness/mass/nodules Lungs: clear to auscultation bilaterally Heart: S1, S2 normal   Assessment:    viral upper respiratory illness   Plan:    Discussed diagnosis and treatment of URI. Discussed the importance of avoiding unnecessary antibiotic therapy. Suggested symptomatic OTC remedies. Nasal saline spray for congestion. Nasal steroids per orders. Follow up as needed.

## 2012-12-02 ENCOUNTER — Encounter: Payer: Self-pay | Admitting: Internal Medicine

## 2012-12-02 ENCOUNTER — Ambulatory Visit (INDEPENDENT_AMBULATORY_CARE_PROVIDER_SITE_OTHER): Payer: Medicare Other | Admitting: Internal Medicine

## 2012-12-02 VITALS — BP 122/74 | HR 78 | Temp 97.8°F | Resp 12 | Ht 71.5 in | Wt 170.0 lb

## 2012-12-02 DIAGNOSIS — J309 Allergic rhinitis, unspecified: Secondary | ICD-10-CM

## 2012-12-02 DIAGNOSIS — E785 Hyperlipidemia, unspecified: Secondary | ICD-10-CM

## 2012-12-02 DIAGNOSIS — Z0000 Encounter for general adult medical examination without abnormal findings: Secondary | ICD-10-CM

## 2012-12-02 DIAGNOSIS — M2550 Pain in unspecified joint: Secondary | ICD-10-CM

## 2012-12-02 LAB — HEPATIC FUNCTION PANEL
AST: 25 U/L (ref 0–37)
Albumin: 4.3 g/dL (ref 3.5–5.2)

## 2012-12-02 LAB — LIPID PANEL
Cholesterol: 144 mg/dL (ref 0–200)
VLDL: 11 mg/dL (ref 0.0–40.0)

## 2012-12-02 LAB — BASIC METABOLIC PANEL
BUN: 19 mg/dL (ref 6–23)
GFR: 77.82 mL/min (ref >60.00)
Glucose, Bld: 93 mg/dL (ref 70–99)
Potassium: 4 mEq/L (ref 3.5–5.1)

## 2012-12-02 LAB — TSH: TSH: 1.14 u[IU]/mL (ref 0.35–5.50)

## 2012-12-02 LAB — SEDIMENTATION RATE: Sed Rate: 29 mm/hr — ABNORMAL HIGH (ref 0–22)

## 2012-12-02 MED ORDER — FLUTICASONE PROPIONATE 50 MCG/ACT NA SUSP
1.0000 | Freq: Two times a day (BID) | NASAL | Status: AC | PRN
Start: 2012-12-02 — End: ?

## 2012-12-02 NOTE — Patient Instructions (Addendum)
Plain Mucinex (NOT D) for thick secretions ;force NON dairy fluids .   Nasal cleansing in the shower as discussed with lather of mild shampoo.After 10 seconds wash off lather while  exhaling through nostrils. Make sure that all residual soap is removed to prevent irritation.  Fluticasone 1 spray in each nostril twice a day as needed. Use the "crossover" technique into opposite nostril spraying toward opposite ear @ 45 degree angle, not straight up into nostril.  Use a Neti pot daily only  as needed for significant sinus congestion; going from open side to congested side . Plain Allegra (NOT D )  160 daily , Loratidine 10 mg , OR Zyrtec 10 mg @ bedtime  as needed for itchy eyes & sneezing.    Please do not use Q-tips as we discussed. Should wax build up occur, please put 2-3 drops of mineral oil in the affected  ear at night to soften the wax .Cover the canal with a  cotton ball to prevent the oil from staining bed linens. In the morning fill the ear canal with hydrogen peroxide & lie in the opposite lateral decubitus position(on the side opposite the affected ear)  for 10-15 minutes. After allowing this period of time for the peroxide to dissolve the wax ;shower and use the thinnest washrag available to wick out the wax. If both ears are involved ; alternate this treatment from ear to ear each night until no wax is found on the washrag. 

## 2012-12-02 NOTE — Progress Notes (Signed)
Subjective:    Patient ID: Antonio Steele, male    DOB: [birth year], 67 y.o.   MRN: [id number]  HPI Medicare Wellness Visit:  Psychosocial & medical history were reviewed as required by Medicare (abuse,antisocial behavioral risks,firearm risk).  Social history: caffeine:1 cup coffee/ week  , alcohol: rarely  ,  tobacco use:   Essentially never Exercise :  See below Home & personal  safety / fall risk:no Limitations of activities of daily living:no Seatbelt  and smoke alarm use:yes Power of Attorney/Living Will status : in place Ophthalmology exam status : current Hearing evaluation status:not current Orientation :oriented X 3 Memory & recall :good Math testing:good Active depression / anxiety:denied Foreign travel history : [place] Immunization status for Shingles /Flu/ PNA/ tetanus : never has had flu (risk discussed) Transfusion history:  no Preventive health surveillance status of colonoscopy as per protocol/ SOC: current Dental care: every 6 mos  Chart reviewed &  Updated. Active issues reviewed & addressed.      Review of Systems He is on a heart healthy diet; he exercises as pickle ball & walking 120 minutes  per week without symptoms. Specifically he denies chest pain, palpitations, dyspnea, or claudication. Family history is negative for premature coronary disease. Advanced cholesterol testing reveals his LDL goal is less than 110.  For approximately a year he's noted some tenderness and swelling to isolated PIP joints of the hands. Topical anti-inflammatory agents have not been of benefit. He questioned whether this might be related to his statin.    Objective:   Physical Exam Gen.: Healthy and well-nourished in appearance. Alert, appropriate and cooperative throughout exam. Head: Normocephalic without obvious abnormalities;  pattern alopecia  Eyes: No corneal or conjunctival inflammation noted. Pupils equal round reactive to light and accommodation.  Extraocular  motion intact. Vision grossly normal without lenses Ears: External  ear exam reveals no significant lesions or deformities. Canals occluded. Hearing is grossly normal bilaterally. Nose: External nasal exam reveals no deformity or inflammation. Nasal mucosa are pink and moist. No lesions or exudates noted.   Mouth: Oral mucosa and oropharynx reveal no lesions or exudates. Teeth in good repair. Neck: No deformities, masses, or tenderness noted. Range of motion & Thyroid normal. Lungs: Normal respiratory effort; chest expands symmetrically. Lungs are clear to auscultation without rales, wheezes, or increased work of breathing. Heart: Normal rate and rhythm. Normal S1 and S2. No gallop, click, or rub.No murmur. Abdomen: Bowel sounds normal; abdomen soft and nontender. No masses, organomegaly or hernias noted. Genitalia: As per Dr Retta Diones                                 Musculoskeletal/extremities: No deformity or scoliosis noted of  the thoracic or lumbar spine.  No clubbing, cyanosis, edema, or significant extremity  deformity noted. Range of motion normal .Tone & strength  Normal. Joints  reveal mild  PIP fusiform changes @ R 4th & L 3rd  . Nail health good. Able to lie down & sit up w/o help. Negative SLR bilaterally Vascular: Carotid, radial artery, dorsalis pedis and  posterior tibial pulses are full and equal. No bruits present. Neurologic: Alert and oriented x3. Deep tendon reflexes symmetrical and normal.    Skin: Intact without suspicious lesions or rashes. Lymph: No cervical, axillary lymphadenopathy present. Psych: Mood and affect are normal. Normally interactive  Assessment & Plan:  #1 Medicare Wellness Exam; criteria met ; data entered #2 Problem List/Diagnoses reviewed #3 arthralgia Plan:  Assessments made/ Orders entered

## 2012-12-03 ENCOUNTER — Encounter: Payer: Self-pay | Admitting: Internal Medicine

## 2012-12-03 NOTE — Telephone Encounter (Signed)
Pt would like to know if he can stop the Pravastatin,Folic Acid,C0Q10 and niacin. Marland KitchenPlease advise

## 2012-12-14 ENCOUNTER — Encounter: Payer: Self-pay | Admitting: Internal Medicine

## 2013-03-10 ENCOUNTER — Other Ambulatory Visit: Payer: Self-pay | Admitting: Internal Medicine

## 2013-03-10 NOTE — Telephone Encounter (Signed)
Temovate refill sent to pharmacy

## 2013-03-17 ENCOUNTER — Encounter: Payer: Self-pay | Admitting: Internal Medicine

## 2013-03-22 ENCOUNTER — Encounter: Payer: Self-pay | Admitting: Internal Medicine

## 2013-03-22 ENCOUNTER — Other Ambulatory Visit: Payer: Self-pay | Admitting: Internal Medicine

## 2013-03-22 DIAGNOSIS — E785 Hyperlipidemia, unspecified: Secondary | ICD-10-CM

## 2013-03-22 MED ORDER — ATORVASTATIN CALCIUM 20 MG PO TABS: 20.0000 mg | ORAL_TABLET | Freq: Every day | ORAL | Status: DC

## 2013-03-23 ENCOUNTER — Telehealth: Payer: Self-pay | Admitting: General Practice

## 2013-03-23 ENCOUNTER — Other Ambulatory Visit: Payer: Self-pay | Admitting: General Practice

## 2013-03-23 DIAGNOSIS — E785 Hyperlipidemia, unspecified: Secondary | ICD-10-CM

## 2013-03-23 MED ORDER — ATORVASTATIN CALCIUM 20 MG PO TABS: 20.0000 mg | ORAL_TABLET | Freq: Every day | ORAL | Status: DC

## 2013-03-23 NOTE — Telephone Encounter (Signed)
I recommend Atorvastatin , more effective

## 2013-03-23 NOTE — Telephone Encounter (Signed)
Pt wife notified Hopp recommends pt being on atorvastatin for his cholesterol. This was sent in to [facility].

## 2013-03-23 NOTE — Telephone Encounter (Signed)
Called and spoke with pt, he advised that he has not been on any cholesterol medications for the last 4 months. Would like to know if you want him to be on the atorvastatin or can he begin the pravastatin again?

## 2013-03-24 ENCOUNTER — Telehealth: Payer: Self-pay | Admitting: *Deleted

## 2013-03-24 NOTE — Telephone Encounter (Signed)
Spoke with pts wife, made aware that pt will need follow up labs in 10 weeks after starting new med. Left call back number for pt to call and schedule this.

## 2013-03-24 NOTE — Telephone Encounter (Signed)
Message copied by Baldwin [place] on Tue Mar 24, 2013 10:12 AM ------      Message from: Pecola Lawless      Created: Sun Mar 22, 2013  3:31 PM       I reviewed insurance labs. I recommend stopping folic acid  & Slo Niacin and changing Pravastatin 20 mg  to Atorvastatin 20 mg with repeat fasting labs after 10 weeks  : CK,Lipids, hepatic panel. Codes: 272.4,995.20. ------

## 2013-04-09 ENCOUNTER — Other Ambulatory Visit: Payer: Self-pay

## 2013-05-30 ENCOUNTER — Encounter: Payer: Self-pay | Admitting: Internal Medicine

## 2013-05-30 DIAGNOSIS — T887XXA Unspecified adverse effect of drug or medicament, initial encounter: Secondary | ICD-10-CM

## 2013-05-30 DIAGNOSIS — E785 Hyperlipidemia, unspecified: Secondary | ICD-10-CM

## 2013-06-01 NOTE — Telephone Encounter (Signed)
Future labs ordered and sent.//AB/CMA 

## 2013-06-01 NOTE — Telephone Encounter (Signed)
Please advise on lab orders. Patient scheduled an appointment for Wednesday.

## 2013-06-03 ENCOUNTER — Other Ambulatory Visit (INDEPENDENT_AMBULATORY_CARE_PROVIDER_SITE_OTHER): Payer: Medicare Other

## 2013-06-03 DIAGNOSIS — T887XXA Unspecified adverse effect of drug or medicament, initial encounter: Secondary | ICD-10-CM

## 2013-06-03 DIAGNOSIS — E785 Hyperlipidemia, unspecified: Secondary | ICD-10-CM

## 2013-06-03 LAB — CK: Total CK: 58 U/L (ref 7–232)

## 2013-06-03 LAB — HEPATIC FUNCTION PANEL
ALT: 35 U/L (ref 0–53)
AST: 25 U/L (ref 0–37)
Alkaline Phosphatase: 48 U/L (ref 39–117)
Bilirubin, Direct: 0.2 mg/dL (ref 0.0–0.3)
Total Protein: 7.2 g/dL (ref 6.0–8.3)

## 2013-06-03 LAB — LIPID PANEL
Cholesterol: 109 mg/dL (ref 0–200)
Total CHOL/HDL Ratio: 3

## 2013-06-15 ENCOUNTER — Other Ambulatory Visit: Payer: Self-pay | Admitting: Internal Medicine

## 2013-06-15 NOTE — Telephone Encounter (Signed)
Atorvastatin refilled per protocol. JG//CMA 

## 2014-06-21 DIAGNOSIS — Z79899 Other long term (current) drug therapy: Secondary | ICD-10-CM | POA: Diagnosis not present

## 2014-06-21 DIAGNOSIS — E780 Pure hypercholesterolemia: Secondary | ICD-10-CM | POA: Diagnosis not present

## 2014-09-21 DIAGNOSIS — S73191A Other sprain of right hip, initial encounter: Secondary | ICD-10-CM | POA: Diagnosis not present

## 2014-09-27 DIAGNOSIS — E780 Pure hypercholesterolemia: Secondary | ICD-10-CM | POA: Diagnosis not present

## 2014-11-29 ENCOUNTER — Other Ambulatory Visit: Payer: Self-pay

## 2014-12-20 DIAGNOSIS — R972 Elevated prostate specific antigen [PSA]: Secondary | ICD-10-CM | POA: Diagnosis not present

## 2014-12-20 DIAGNOSIS — E780 Pure hypercholesterolemia: Secondary | ICD-10-CM | POA: Diagnosis not present

## 2014-12-20 DIAGNOSIS — Z1389 Encounter for screening for other disorder: Secondary | ICD-10-CM | POA: Diagnosis not present

## 2014-12-20 DIAGNOSIS — R0989 Other specified symptoms and signs involving the circulatory and respiratory systems: Secondary | ICD-10-CM | POA: Diagnosis not present

## 2014-12-20 DIAGNOSIS — Z0000 Encounter for general adult medical examination without abnormal findings: Secondary | ICD-10-CM | POA: Diagnosis not present

## 2014-12-21 ENCOUNTER — Other Ambulatory Visit: Payer: Self-pay | Admitting: Family Medicine

## 2014-12-21 DIAGNOSIS — R0989 Other specified symptoms and signs involving the circulatory and respiratory systems: Secondary | ICD-10-CM

## 2014-12-27 ENCOUNTER — Ambulatory Visit
Admission: RE | Admit: 2014-12-27 | Discharge: 2014-12-27 | Disposition: A | Discharge status: Home / Self Care | Payer: Commercial Managed Care - HMO | Source: Ambulatory Visit | Attending: Family Medicine | Admitting: Family Medicine

## 2014-12-27 DIAGNOSIS — R0989 Other specified symptoms and signs involving the circulatory and respiratory systems: Secondary | ICD-10-CM

## 2014-12-27 DIAGNOSIS — I6521 Occlusion and stenosis of right carotid artery: Secondary | ICD-10-CM | POA: Diagnosis not present

## 2015-04-13 DIAGNOSIS — N400 Enlarged prostate without lower urinary tract symptoms: Secondary | ICD-10-CM | POA: Diagnosis not present

## 2015-04-20 DIAGNOSIS — N400 Enlarged prostate without lower urinary tract symptoms: Secondary | ICD-10-CM | POA: Diagnosis not present

## 2015-04-20 DIAGNOSIS — R972 Elevated prostate specific antigen [PSA]: Secondary | ICD-10-CM | POA: Diagnosis not present

## 2015-07-20 DIAGNOSIS — E7800 Pure hypercholesterolemia, unspecified: Secondary | ICD-10-CM | POA: Diagnosis not present

## 2015-11-07 IMAGING — US US CAROTID DUPLEX BILAT
1 series · 13 of 24 positions shown · non-contrast
Comparison: None.

CLINICAL DATA: Left carotid bruit, asymptomatic.

EXAM:
BILATERAL CAROTID DUPLEX ULTRASOUND
TECHNIQUE: Gray scale imaging, color Doppler and duplex ultrasound was
performed of bilateral carotid and vertebral arteries in the neck.

[Series 1: us carotid duplex bilat · 0.06mm/px · 13 of 54 slices shown]
[im 1/54]
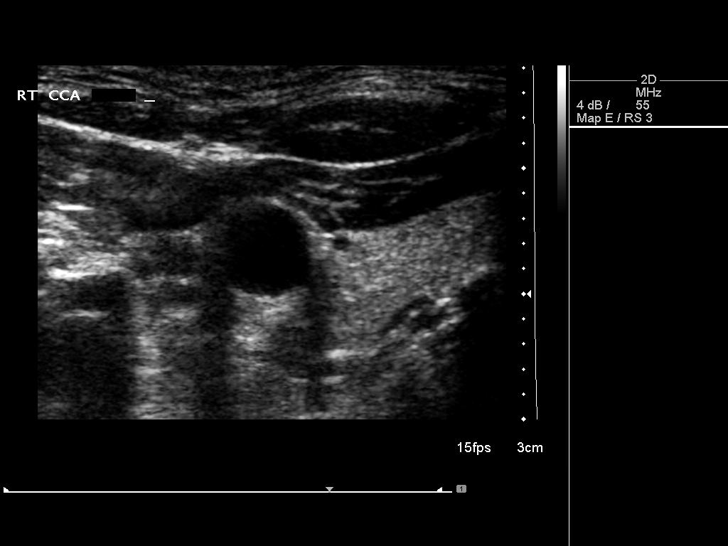
[im 5/54]
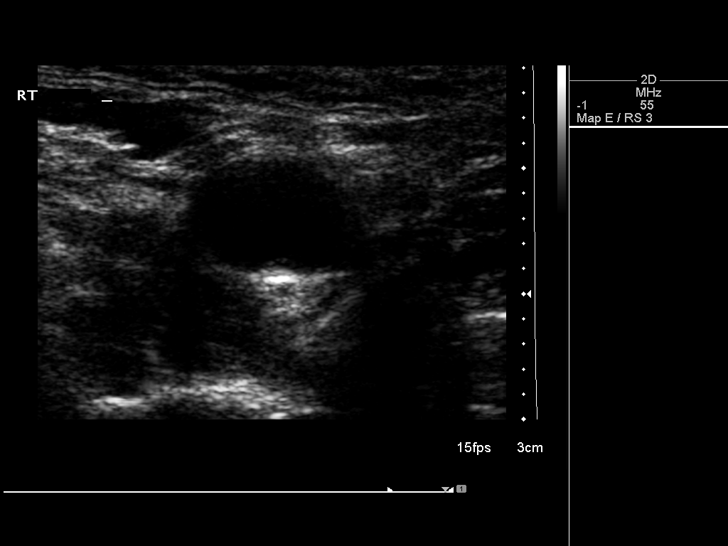
[im 10/54]
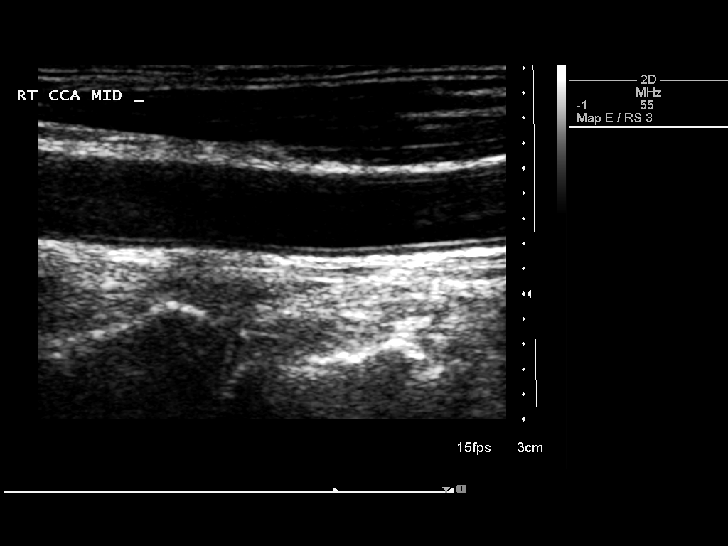
[im 14/54]
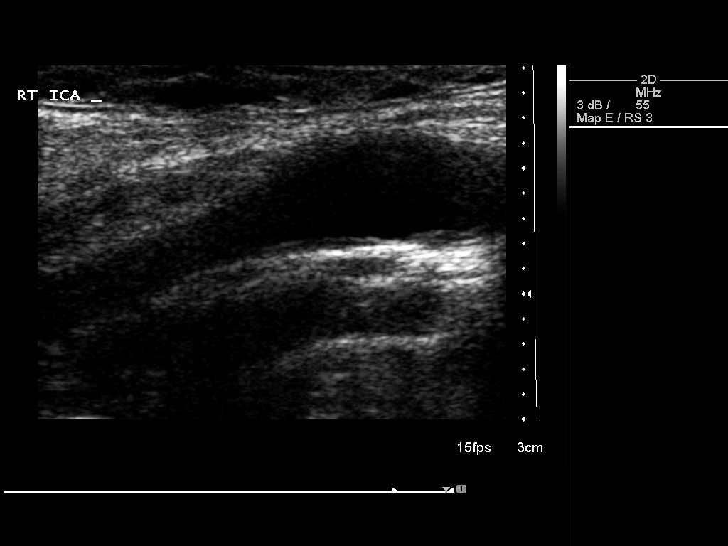
[im 19/54]
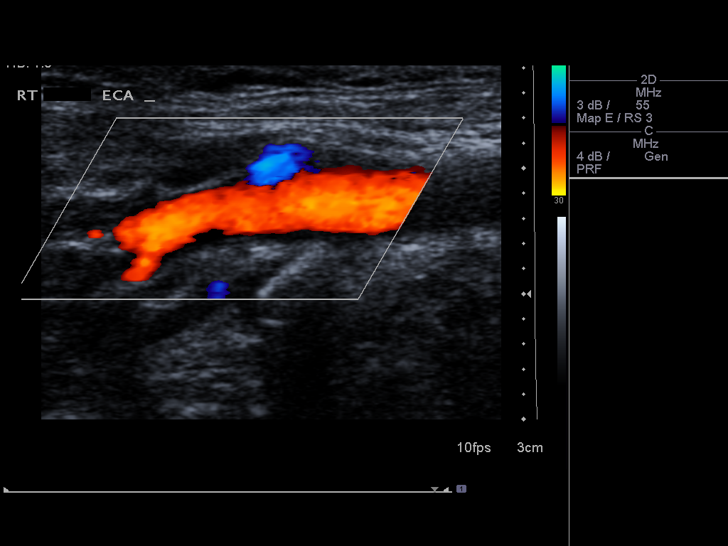
[im 24/54]
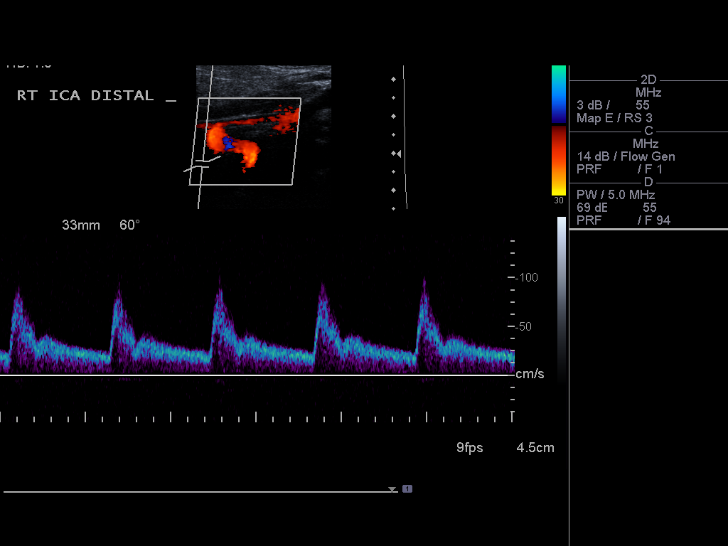
[im 28/54]
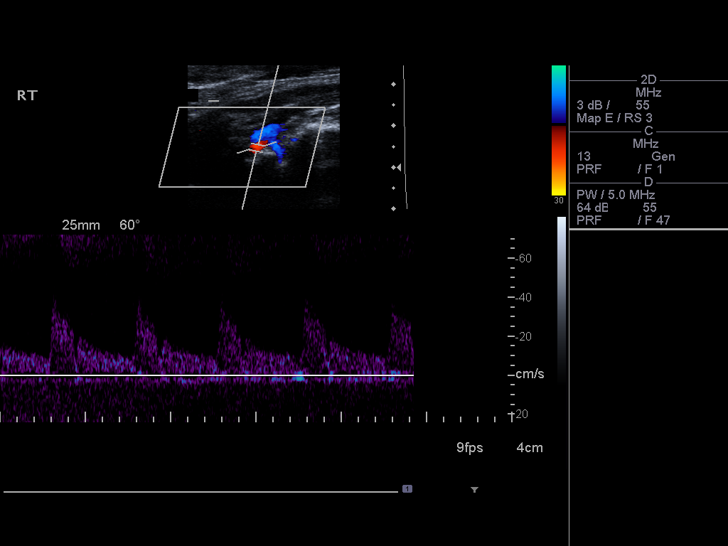
[im 30/54]
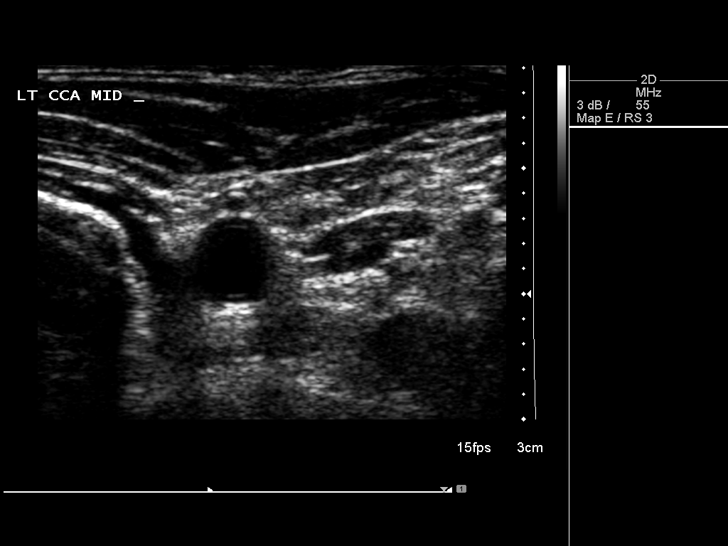
[im 35/54]
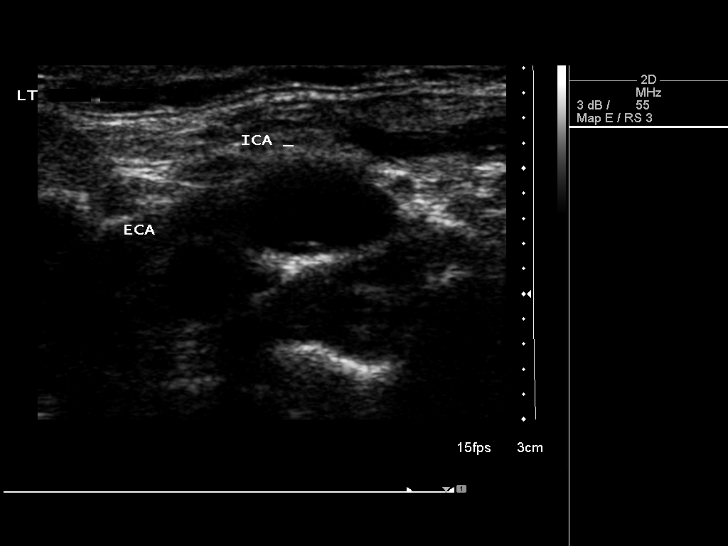
[im 40/54]
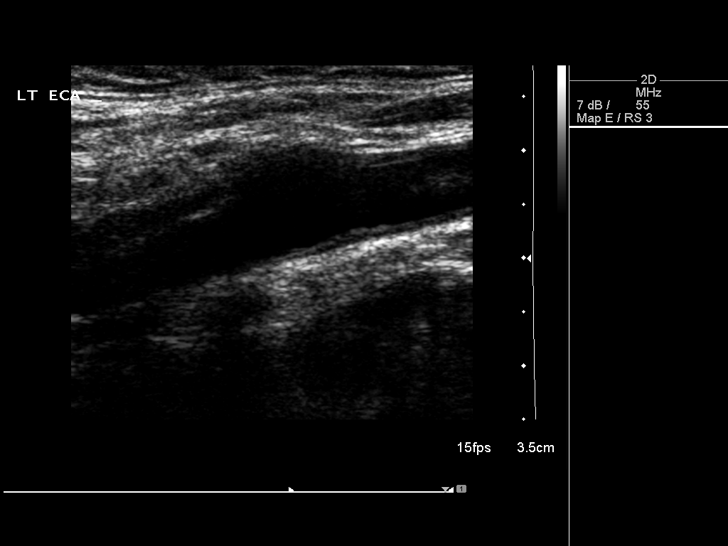
[im 44/54]
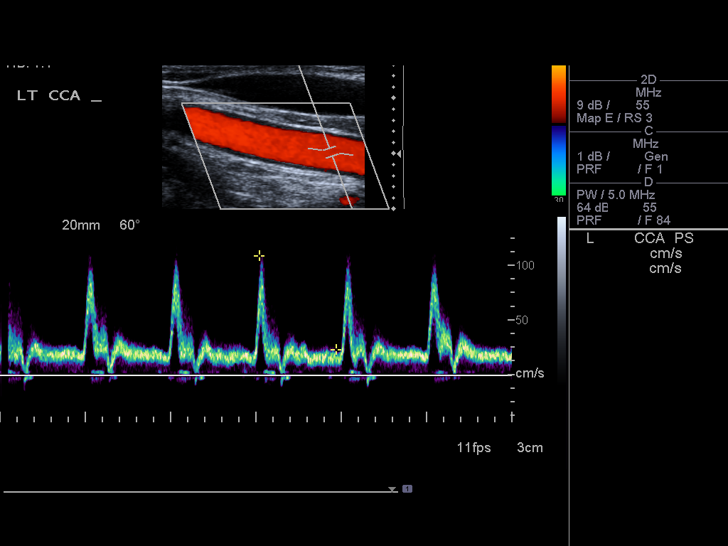
[im 49/54]
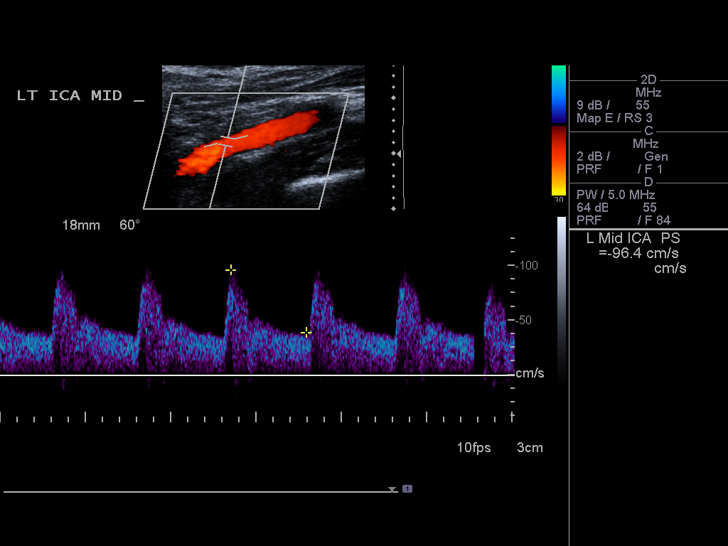
[im 54/54]
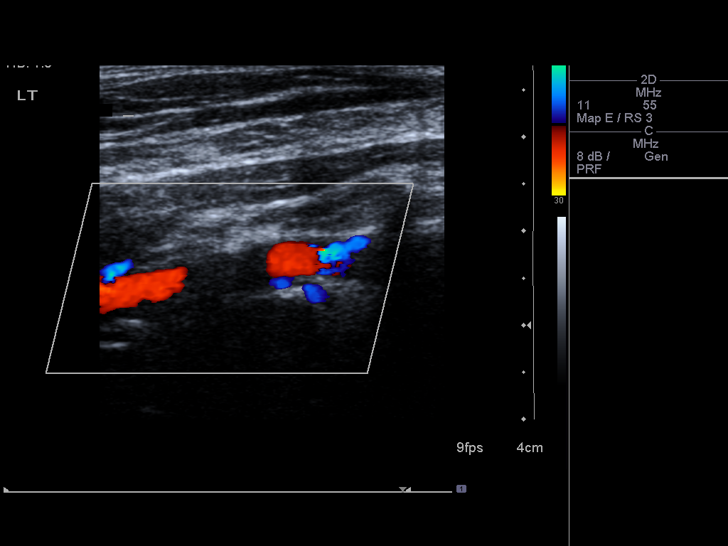

[13 of 24 positions shown; findings below may reference images not displayed]

REVIEW OF SYSTEMS:
Quantification of carotid stenosis is based on velocity parameters
that correlate the residual internal carotid diameter with
NASCET-based stenosis levels, using the diameter of the distal
internal carotid lumen as the denominator for stenosis measurement.

The following velocity measurements were obtained:

PEAK SYSTOLIC/END DIASTOLIC

RIGHT

ICA:                     102/24cm/sec

CCA:                     133/17cm/sec

SYSTOLIC ICA/CCA RATIO:

DIASTOLIC ICA/CCA RATIO:

ECA:                     118cm/sec

LEFT

ICA:                     96/39cm/sec

CCA:                     109/23cm/sec

SYSTOLIC ICA/CCA RATIO:

DIASTOLIC ICA/CCA RATIO:

ECA:                     91cm/sec
FINDINGS: RIGHT CAROTID ARTERY: Mild smooth plaque in the carotid bulb. No
high-grade stenosis. Normal waveforms and color Doppler signal.
Distal ICA tortuous.

RIGHT VERTEBRAL ARTERY:  Normal flow direction and waveform.

LEFT CAROTID ARTERY: Intimal thickening in the bulb. No focal plaque
accumulation or stenosis. Normal waveforms and color Doppler signal.

LEFT VERTEBRAL ARTERY: Normal flow direction and waveform.
IMPRESSION: 1. Mild right carotid bulb plaque resulting in less than 50%
diameter stenosis. The exam does not exclude plaque ulceration or
embolization. Continued surveillance recommended.

## 2015-12-30 DIAGNOSIS — E7800 Pure hypercholesterolemia, unspecified: Secondary | ICD-10-CM | POA: Diagnosis not present

## 2015-12-30 DIAGNOSIS — Z0000 Encounter for general adult medical examination without abnormal findings: Secondary | ICD-10-CM | POA: Diagnosis not present

## 2015-12-30 DIAGNOSIS — Z125 Encounter for screening for malignant neoplasm of prostate: Secondary | ICD-10-CM | POA: Diagnosis not present

## 2016-01-05 DIAGNOSIS — E7800 Pure hypercholesterolemia, unspecified: Secondary | ICD-10-CM | POA: Diagnosis not present

## 2016-01-05 DIAGNOSIS — L989 Disorder of the skin and subcutaneous tissue, unspecified: Secondary | ICD-10-CM | POA: Diagnosis not present

## 2016-01-05 DIAGNOSIS — R972 Elevated prostate specific antigen [PSA]: Secondary | ICD-10-CM | POA: Diagnosis not present

## 2016-01-05 DIAGNOSIS — R350 Frequency of micturition: Secondary | ICD-10-CM | POA: Diagnosis not present

## 2016-01-05 DIAGNOSIS — Z1389 Encounter for screening for other disorder: Secondary | ICD-10-CM | POA: Diagnosis not present

## 2016-01-05 DIAGNOSIS — D696 Thrombocytopenia, unspecified: Secondary | ICD-10-CM | POA: Diagnosis not present

## 2016-01-05 DIAGNOSIS — Z0000 Encounter for general adult medical examination without abnormal findings: Secondary | ICD-10-CM | POA: Diagnosis not present

## 2016-04-19 DIAGNOSIS — R972 Elevated prostate specific antigen [PSA]: Secondary | ICD-10-CM | POA: Diagnosis not present

## 2016-04-25 DIAGNOSIS — R972 Elevated prostate specific antigen [PSA]: Secondary | ICD-10-CM | POA: Diagnosis not present

## 2016-04-25 DIAGNOSIS — N5201 Erectile dysfunction due to arterial insufficiency: Secondary | ICD-10-CM | POA: Diagnosis not present

## 2016-04-25 DIAGNOSIS — N400 Enlarged prostate without lower urinary tract symptoms: Secondary | ICD-10-CM | POA: Diagnosis not present

## 2016-09-27 ENCOUNTER — Telehealth: Payer: Self-pay

## 2016-09-27 NOTE — Telephone Encounter (Signed)
I left a message asking the patient to call and confirm PCP since Dr. Linna Darner has retired. Antonio Steele ([place])

## 2016-10-02 NOTE — Telephone Encounter (Signed)
2nd attempt to contact patient.  I left a message. Duane Lope ([place])

## 2016-10-04 NOTE — Telephone Encounter (Signed)
3rd attempt to reach the patient.  I left a message. Antonio Steele ([place])

## 2016-10-30 ENCOUNTER — Encounter: Payer: Self-pay | Admitting: Gastroenterology

## 2017-01-30 ENCOUNTER — Other Ambulatory Visit: Payer: Self-pay | Admitting: Family Medicine

## 2017-01-30 DIAGNOSIS — R9389 Abnormal findings on diagnostic imaging of other specified body structures: Secondary | ICD-10-CM

## 2017-04-09 ENCOUNTER — Ambulatory Visit
Admission: RE | Admit: 2017-04-09 | Discharge: 2017-04-09 | Disposition: A | Discharge status: Home / Self Care | Payer: Medicare PPO | Source: Ambulatory Visit | Attending: Family Medicine | Admitting: Family Medicine

## 2017-04-09 DIAGNOSIS — R9389 Abnormal findings on diagnostic imaging of other specified body structures: Secondary | ICD-10-CM

## 2017-04-23 ENCOUNTER — Other Ambulatory Visit: Payer: Self-pay | Admitting: Gastroenterology

## 2017-04-23 DIAGNOSIS — D696 Thrombocytopenia, unspecified: Secondary | ICD-10-CM

## 2018-11-24 DIAGNOSIS — M79645 Pain in left finger(s): Secondary | ICD-10-CM | POA: Diagnosis not present

## 2019-03-10 DIAGNOSIS — L409 Psoriasis, unspecified: Secondary | ICD-10-CM | POA: Diagnosis not present

## 2019-03-10 DIAGNOSIS — R972 Elevated prostate specific antigen [PSA]: Secondary | ICD-10-CM | POA: Diagnosis not present

## 2019-03-10 DIAGNOSIS — D696 Thrombocytopenia, unspecified: Secondary | ICD-10-CM | POA: Diagnosis not present

## 2019-03-10 DIAGNOSIS — E7800 Pure hypercholesterolemia, unspecified: Secondary | ICD-10-CM | POA: Diagnosis not present

## 2019-03-10 DIAGNOSIS — M79645 Pain in left finger(s): Secondary | ICD-10-CM | POA: Diagnosis not present

## 2019-03-17 DIAGNOSIS — R972 Elevated prostate specific antigen [PSA]: Secondary | ICD-10-CM | POA: Diagnosis not present

## 2019-03-17 DIAGNOSIS — E7800 Pure hypercholesterolemia, unspecified: Secondary | ICD-10-CM | POA: Diagnosis not present

## 2019-03-17 DIAGNOSIS — M79645 Pain in left finger(s): Secondary | ICD-10-CM | POA: Diagnosis not present

## 2019-03-17 DIAGNOSIS — D696 Thrombocytopenia, unspecified: Secondary | ICD-10-CM | POA: Diagnosis not present

## 2019-03-17 DIAGNOSIS — L409 Psoriasis, unspecified: Secondary | ICD-10-CM | POA: Diagnosis not present

## 2019-05-22 DIAGNOSIS — R972 Elevated prostate specific antigen [PSA]: Secondary | ICD-10-CM | POA: Diagnosis not present

## 2019-05-22 DIAGNOSIS — N5201 Erectile dysfunction due to arterial insufficiency: Secondary | ICD-10-CM | POA: Diagnosis not present

## 2019-06-17 ENCOUNTER — Ambulatory Visit: Payer: Medicare Other | Attending: Internal Medicine

## 2019-06-17 DIAGNOSIS — Z23 Encounter for immunization: Secondary | ICD-10-CM

## 2019-06-17 NOTE — Progress Notes (Signed)
   Covid-19 Vaccination Clinic  Name:  Zyien Dieken    MRN: [id number] DOB: Jun 29, 1945  06/17/2019  Mr. Sabine was observed post Covid-19 immunization for 15 minutes without incidence. He was provided with Vaccine Information Sheet and instruction to access the V-Safe system.   Mr. Coney was instructed to call 911 with any severe reactions post vaccine: Marland Kitchen Difficulty breathing  . Swelling of your face and throat  . A fast heartbeat  . A bad rash all over your body  . Dizziness and weakness    Immunizations Administered    Name Date Dose VIS Date Route   Pfizer COVID-19 Vaccine 06/17/2019 11:32 AM 0.3 mL 05/15/2019 Intramuscular   Manufacturer: [place]   Lot: [id number]   [place]: [id number]

## 2019-07-08 ENCOUNTER — Ambulatory Visit: Payer: Medicare HMO | Attending: Internal Medicine

## 2019-07-08 DIAGNOSIS — Z23 Encounter for immunization: Secondary | ICD-10-CM | POA: Insufficient documentation

## 2019-07-08 NOTE — Progress Notes (Signed)
   Covid-19 Vaccination Clinic  Name:  Antonio Steele    MRN: [id number] DOB: Nov 03, 1945  07/08/2019  [name] was observed post Covid-19 immunization for 15 minutes without incidence. He was provided with Vaccine Information Sheet and instruction to access the V-Safe system.   [name] was instructed to call 911 with any severe reactions post vaccine: Marland Kitchen Difficulty breathing  . Swelling of your face and throat  . A fast heartbeat  . A bad rash all over your body  . Dizziness and weakness    Immunizations Administered    Name Date Dose VIS Date Route   Pfizer COVID-19 Vaccine 07/08/2019 10:29 AM 0.3 mL 05/15/2019 Intramuscular   Manufacturer: [place]   Lot: [id number]   [place]: [id number]

## 2019-09-21 IMAGING — US US CAROTID DUPLEX BILAT
1 series · 14 of 24 positions shown · non-contrast
Comparison: 12/27/2014

CLINICAL DATA: Right bulb plaque

EXAM:
BILATERAL CAROTID DUPLEX ULTRASOUND
TECHNIQUE: Gray scale imaging, color Doppler and duplex ultrasound were
performed of bilateral carotid and vertebral arteries in the neck.

[Series 1: us carotid duplex bilat · 0.05mm/px · 14 of 43 slices shown]
[im 1/43]
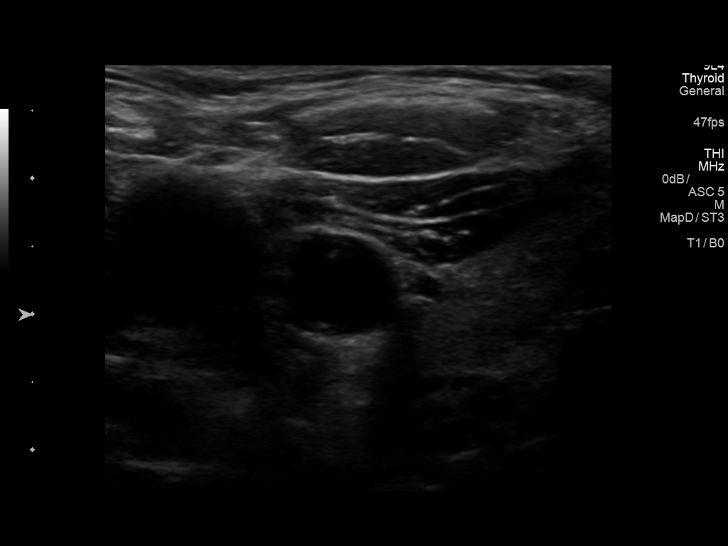
[im 4/43]
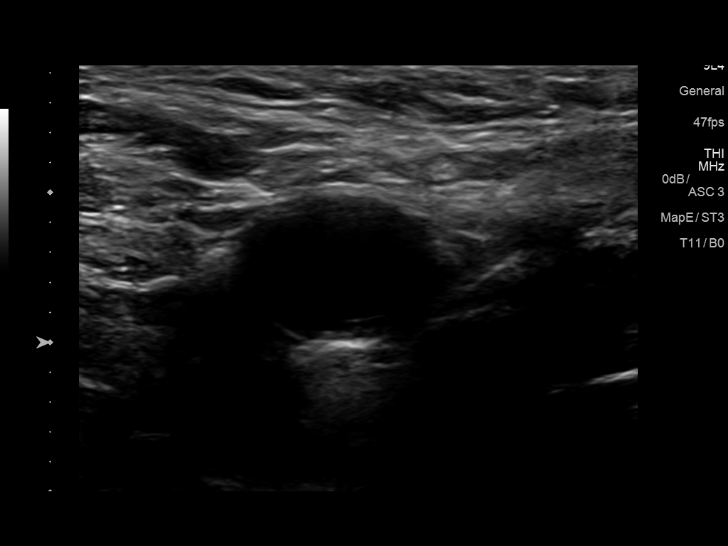
[im 8/43]
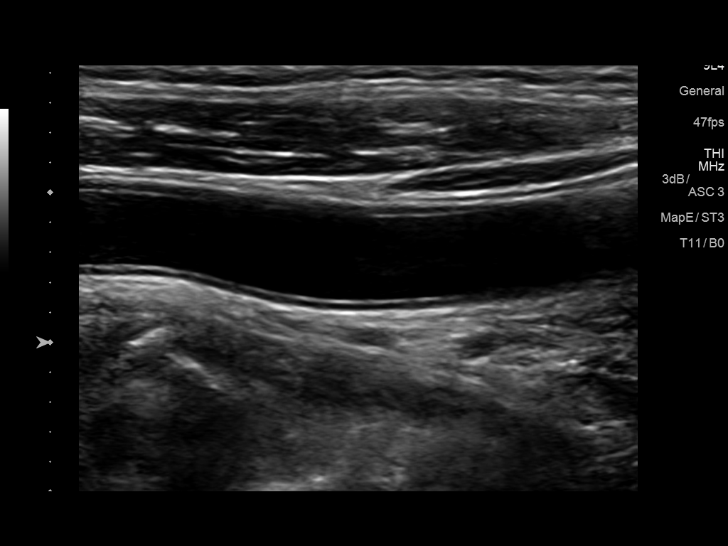
[im 11/43]
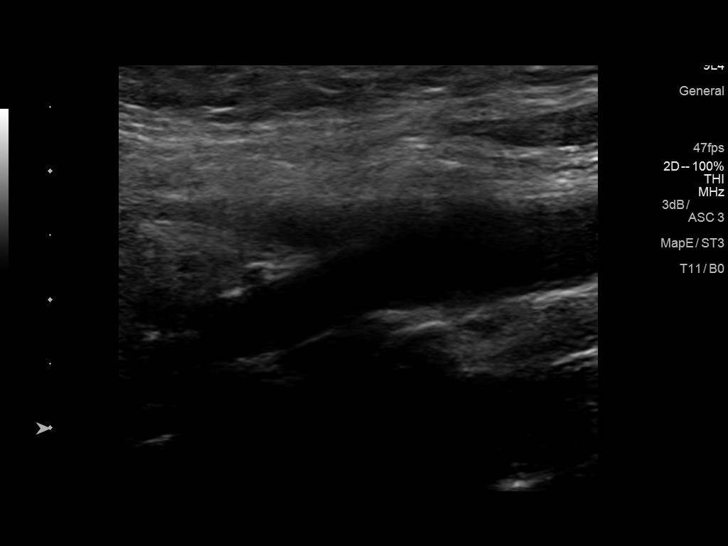
[im 13/43]
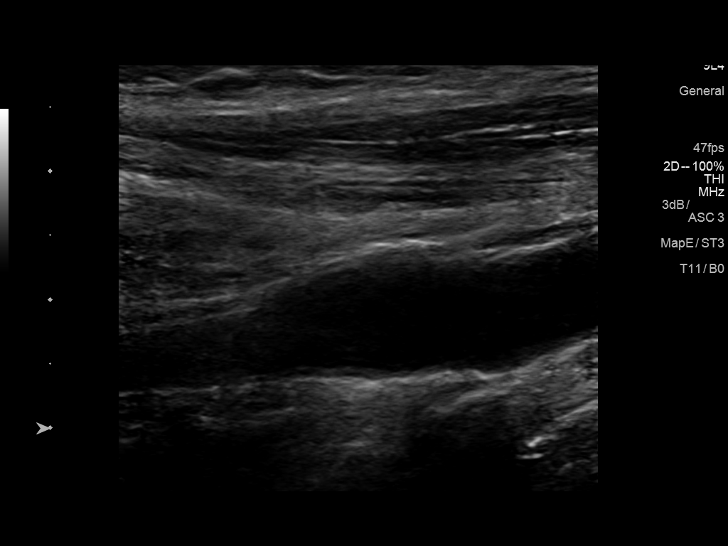
[im 17/43]
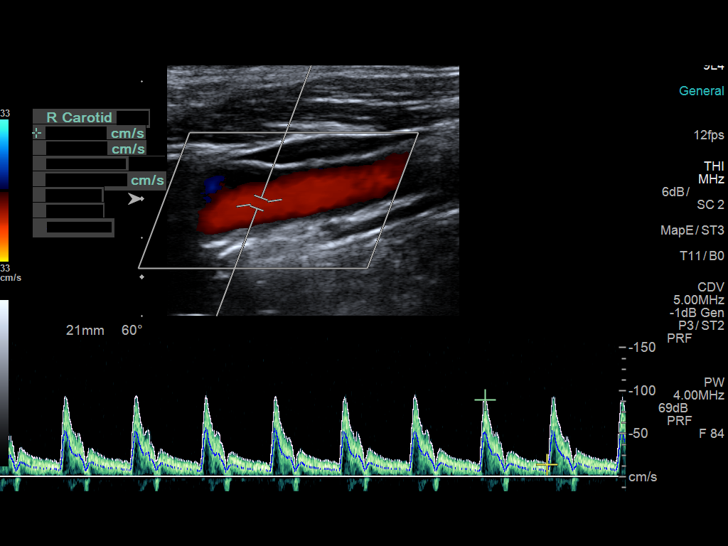
[im 21/43]
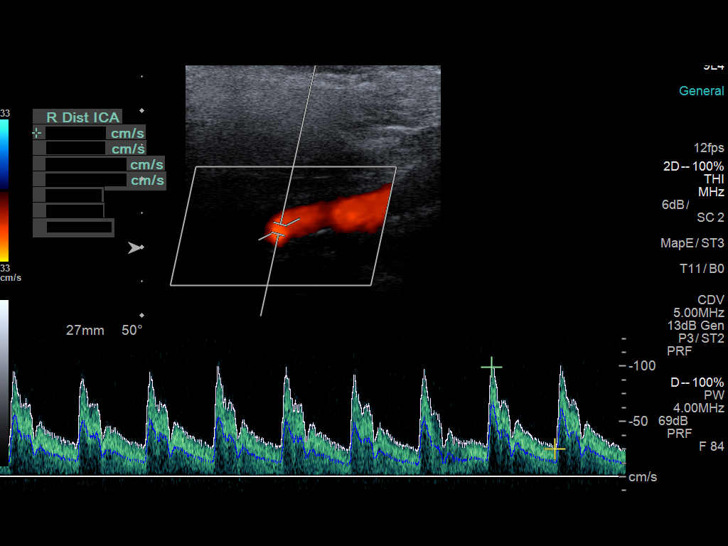
[im 22/43]
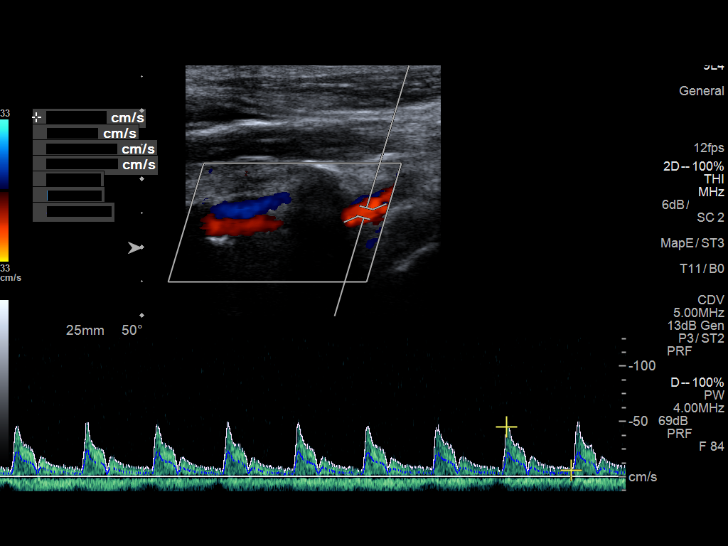
[im 26/43]
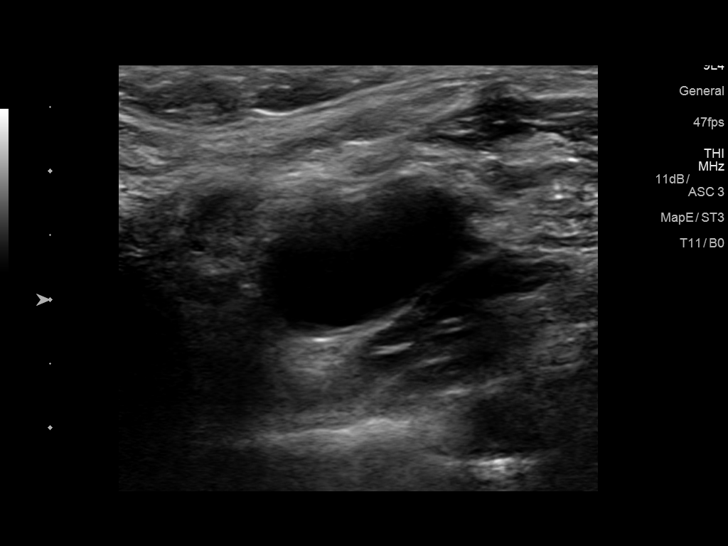
[im 30/43]
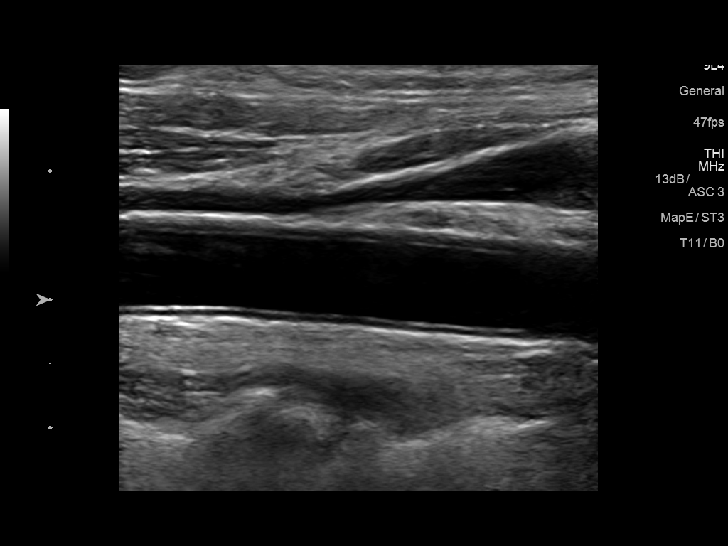
[im 33/43]
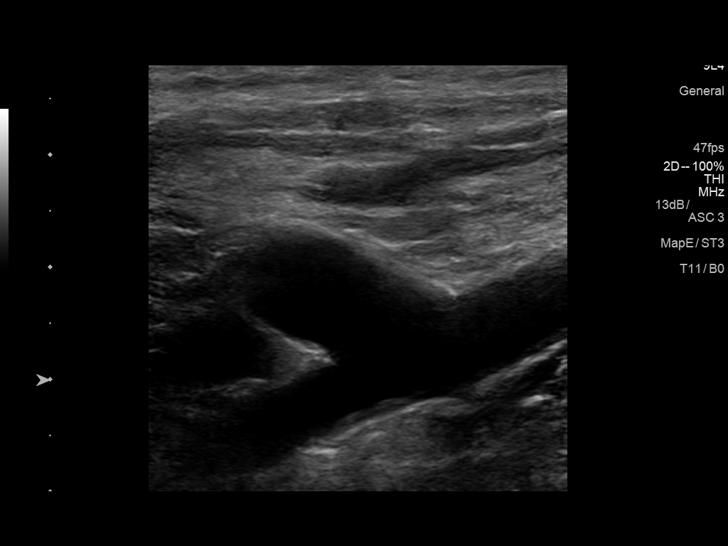
[im 35/43]
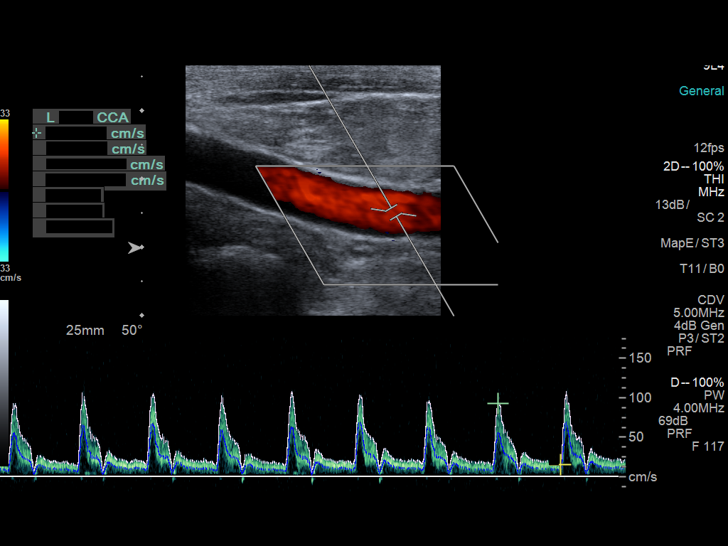
[im 39/43]
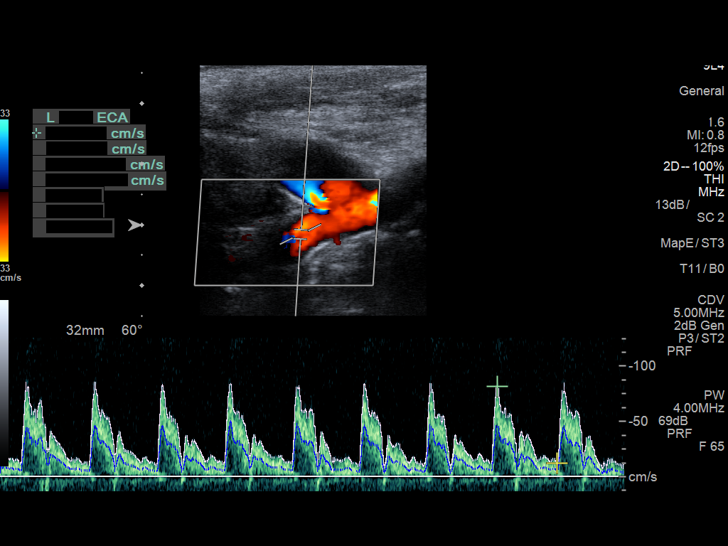
[im 43/43]
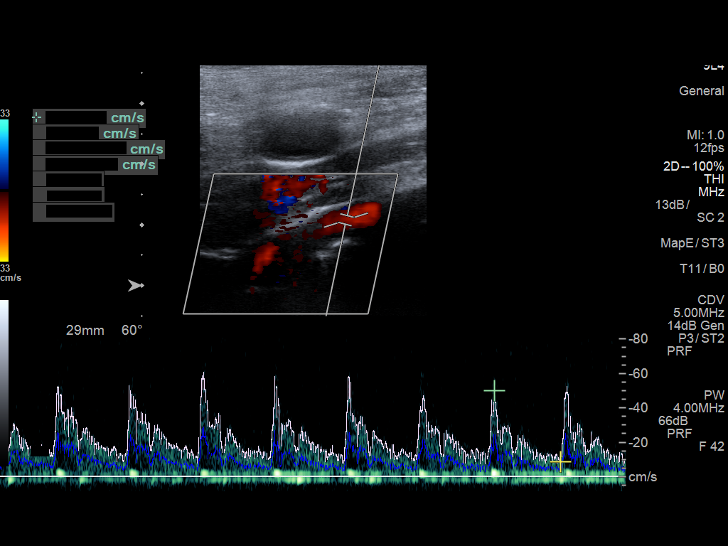

[14 of 24 positions shown; findings below may reference images not displayed]

FINDINGS: Criteria: Quantification of carotid stenosis is based on velocity
parameters that correlate the residual internal carotid diameter
with NASCET-based stenosis levels, using the diameter of the distal
internal carotid lumen as the denominator for stenosis measurement.

The following velocity measurements were obtained:

RIGHT

ICA:  102 cm/sec

CCA:  131 cm/sec

SYSTOLIC ICA/CCA RATIO:

DIASTOLIC ICA/CCA RATIO:

ECA:  105 cm/sec

LEFT

ICA:  82 cm/sec

CCA:  109 cm/sec

SYSTOLIC ICA/CCA RATIO:

DIASTOLIC ICA/CCA RATIO:

ECA:  82 cm/sec

RIGHT CAROTID ARTERY: Little if any plaque in the bulb. Low
resistance internal carotid Doppler pattern is preserved.

RIGHT VERTEBRAL ARTERY:  Antegrade.

LEFT CAROTID ARTERY: Little if any plaque in the bulb. Low
resistance internal carotid Doppler pattern.

LEFT VERTEBRAL ARTERY:  Antegrade.
IMPRESSION: Less than 50% stenosis in the right and left internal carotid
artery.

## 2020-02-15 DIAGNOSIS — M778 Other enthesopathies, not elsewhere classified: Secondary | ICD-10-CM | POA: Diagnosis not present

## 2020-04-05 DIAGNOSIS — L409 Psoriasis, unspecified: Secondary | ICD-10-CM | POA: Diagnosis not present

## 2020-04-05 DIAGNOSIS — R972 Elevated prostate specific antigen [PSA]: Secondary | ICD-10-CM | POA: Diagnosis not present

## 2020-04-05 DIAGNOSIS — D696 Thrombocytopenia, unspecified: Secondary | ICD-10-CM | POA: Diagnosis not present

## 2020-04-05 DIAGNOSIS — Z0000 Encounter for general adult medical examination without abnormal findings: Secondary | ICD-10-CM | POA: Diagnosis not present

## 2020-04-05 DIAGNOSIS — E7800 Pure hypercholesterolemia, unspecified: Secondary | ICD-10-CM | POA: Diagnosis not present

## 2020-04-05 DIAGNOSIS — M778 Other enthesopathies, not elsewhere classified: Secondary | ICD-10-CM | POA: Diagnosis not present

## 2020-06-10 DIAGNOSIS — N5201 Erectile dysfunction due to arterial insufficiency: Secondary | ICD-10-CM | POA: Diagnosis not present

## 2020-06-10 DIAGNOSIS — R972 Elevated prostate specific antigen [PSA]: Secondary | ICD-10-CM | POA: Diagnosis not present

## 2020-07-25 DIAGNOSIS — L821 Other seborrheic keratosis: Secondary | ICD-10-CM | POA: Diagnosis not present

## 2020-07-25 DIAGNOSIS — L400 Psoriasis vulgaris: Secondary | ICD-10-CM | POA: Diagnosis not present

## 2020-07-25 DIAGNOSIS — D1801 Hemangioma of skin and subcutaneous tissue: Secondary | ICD-10-CM | POA: Diagnosis not present

## 2020-07-25 DIAGNOSIS — L820 Inflamed seborrheic keratosis: Secondary | ICD-10-CM | POA: Diagnosis not present

## 2020-08-04 DIAGNOSIS — L401 Generalized pustular psoriasis: Secondary | ICD-10-CM | POA: Diagnosis not present

## 2020-08-04 DIAGNOSIS — E663 Overweight: Secondary | ICD-10-CM | POA: Diagnosis not present

## 2020-08-04 DIAGNOSIS — Z6826 Body mass index (BMI) 26.0-26.9, adult: Secondary | ICD-10-CM | POA: Diagnosis not present

## 2020-08-04 DIAGNOSIS — M4690 Unspecified inflammatory spondylopathy, site unspecified: Secondary | ICD-10-CM | POA: Diagnosis not present

## 2020-08-04 DIAGNOSIS — M2550 Pain in unspecified joint: Secondary | ICD-10-CM | POA: Diagnosis not present

## 2020-09-05 DIAGNOSIS — L400 Psoriasis vulgaris: Secondary | ICD-10-CM | POA: Diagnosis not present

## 2020-09-05 DIAGNOSIS — L309 Dermatitis, unspecified: Secondary | ICD-10-CM | POA: Diagnosis not present

## 2020-09-12 DIAGNOSIS — E663 Overweight: Secondary | ICD-10-CM | POA: Diagnosis not present

## 2020-09-12 DIAGNOSIS — L401 Generalized pustular psoriasis: Secondary | ICD-10-CM | POA: Diagnosis not present

## 2020-09-12 DIAGNOSIS — M2550 Pain in unspecified joint: Secondary | ICD-10-CM | POA: Diagnosis not present

## 2020-09-12 DIAGNOSIS — Z6826 Body mass index (BMI) 26.0-26.9, adult: Secondary | ICD-10-CM | POA: Diagnosis not present

## 2020-09-12 DIAGNOSIS — M4690 Unspecified inflammatory spondylopathy, site unspecified: Secondary | ICD-10-CM | POA: Diagnosis not present

## 2020-11-07 DIAGNOSIS — L400 Psoriasis vulgaris: Secondary | ICD-10-CM | POA: Diagnosis not present

## 2020-11-07 DIAGNOSIS — L309 Dermatitis, unspecified: Secondary | ICD-10-CM | POA: Diagnosis not present

## 2020-12-08 DIAGNOSIS — R5383 Other fatigue: Secondary | ICD-10-CM | POA: Diagnosis not present

## 2020-12-08 DIAGNOSIS — U071 COVID-19: Secondary | ICD-10-CM | POA: Diagnosis not present

## 2021-01-05 DIAGNOSIS — L821 Other seborrheic keratosis: Secondary | ICD-10-CM | POA: Diagnosis not present

## 2021-01-05 DIAGNOSIS — L400 Psoriasis vulgaris: Secondary | ICD-10-CM | POA: Diagnosis not present

## 2021-01-05 DIAGNOSIS — B353 Tinea pedis: Secondary | ICD-10-CM | POA: Diagnosis not present

## 2021-01-12 DIAGNOSIS — M2550 Pain in unspecified joint: Secondary | ICD-10-CM | POA: Diagnosis not present

## 2021-01-12 DIAGNOSIS — L401 Generalized pustular psoriasis: Secondary | ICD-10-CM | POA: Diagnosis not present

## 2021-01-12 DIAGNOSIS — Z6826 Body mass index (BMI) 26.0-26.9, adult: Secondary | ICD-10-CM | POA: Diagnosis not present

## 2021-01-12 DIAGNOSIS — M4690 Unspecified inflammatory spondylopathy, site unspecified: Secondary | ICD-10-CM | POA: Diagnosis not present

## 2021-01-12 DIAGNOSIS — E663 Overweight: Secondary | ICD-10-CM | POA: Diagnosis not present

## 2021-01-26 DIAGNOSIS — Z6826 Body mass index (BMI) 26.0-26.9, adult: Secondary | ICD-10-CM | POA: Diagnosis not present

## 2021-01-26 DIAGNOSIS — Z008 Encounter for other general examination: Secondary | ICD-10-CM | POA: Diagnosis not present

## 2021-01-26 DIAGNOSIS — E663 Overweight: Secondary | ICD-10-CM | POA: Diagnosis not present

## 2021-01-26 DIAGNOSIS — B353 Tinea pedis: Secondary | ICD-10-CM | POA: Diagnosis not present

## 2021-01-26 DIAGNOSIS — Z0000 Encounter for general adult medical examination without abnormal findings: Secondary | ICD-10-CM | POA: Diagnosis not present

## 2021-01-26 DIAGNOSIS — L409 Psoriasis, unspecified: Secondary | ICD-10-CM | POA: Diagnosis not present

## 2021-01-26 DIAGNOSIS — M069 Rheumatoid arthritis, unspecified: Secondary | ICD-10-CM | POA: Diagnosis not present

## 2021-04-11 DIAGNOSIS — L409 Psoriasis, unspecified: Secondary | ICD-10-CM | POA: Diagnosis not present

## 2021-04-11 DIAGNOSIS — Z125 Encounter for screening for malignant neoplasm of prostate: Secondary | ICD-10-CM | POA: Diagnosis not present

## 2021-04-11 DIAGNOSIS — E7800 Pure hypercholesterolemia, unspecified: Secondary | ICD-10-CM | POA: Diagnosis not present

## 2021-04-11 DIAGNOSIS — Z6827 Body mass index (BMI) 27.0-27.9, adult: Secondary | ICD-10-CM | POA: Diagnosis not present

## 2021-04-11 DIAGNOSIS — Z0000 Encounter for general adult medical examination without abnormal findings: Secondary | ICD-10-CM | POA: Diagnosis not present

## 2021-04-11 DIAGNOSIS — Z8616 Personal history of COVID-19: Secondary | ICD-10-CM | POA: Diagnosis not present

## 2021-06-07 DIAGNOSIS — R972 Elevated prostate specific antigen [PSA]: Secondary | ICD-10-CM | POA: Diagnosis not present

## 2021-06-07 DIAGNOSIS — N400 Enlarged prostate without lower urinary tract symptoms: Secondary | ICD-10-CM | POA: Diagnosis not present

## 2021-06-07 DIAGNOSIS — N5201 Erectile dysfunction due to arterial insufficiency: Secondary | ICD-10-CM | POA: Diagnosis not present

## 2021-07-11 DIAGNOSIS — L400 Psoriasis vulgaris: Secondary | ICD-10-CM | POA: Diagnosis not present

## 2021-07-11 DIAGNOSIS — D1801 Hemangioma of skin and subcutaneous tissue: Secondary | ICD-10-CM | POA: Diagnosis not present

## 2021-07-18 DIAGNOSIS — E663 Overweight: Secondary | ICD-10-CM | POA: Diagnosis not present

## 2021-07-18 DIAGNOSIS — L401 Generalized pustular psoriasis: Secondary | ICD-10-CM | POA: Diagnosis not present

## 2021-07-18 DIAGNOSIS — M4690 Unspecified inflammatory spondylopathy, site unspecified: Secondary | ICD-10-CM | POA: Diagnosis not present

## 2021-07-18 DIAGNOSIS — Z6826 Body mass index (BMI) 26.0-26.9, adult: Secondary | ICD-10-CM | POA: Diagnosis not present

## 2022-02-13 DIAGNOSIS — H8111 Benign paroxysmal vertigo, right ear: Secondary | ICD-10-CM | POA: Diagnosis not present

## 2022-02-13 DIAGNOSIS — R051 Acute cough: Secondary | ICD-10-CM | POA: Diagnosis not present

## 2022-02-13 DIAGNOSIS — J019 Acute sinusitis, unspecified: Secondary | ICD-10-CM | POA: Diagnosis not present

## 2022-02-13 DIAGNOSIS — R5383 Other fatigue: Secondary | ICD-10-CM | POA: Diagnosis not present

## 2022-02-13 DIAGNOSIS — H6993 Unspecified Eustachian tube disorder, bilateral: Secondary | ICD-10-CM | POA: Diagnosis not present

## 2022-02-13 DIAGNOSIS — U071 COVID-19: Secondary | ICD-10-CM | POA: Diagnosis not present

## 2022-02-13 DIAGNOSIS — R0981 Nasal congestion: Secondary | ICD-10-CM | POA: Diagnosis not present

## 2022-02-28 DIAGNOSIS — R062 Wheezing: Secondary | ICD-10-CM | POA: Diagnosis not present

## 2022-02-28 DIAGNOSIS — H6123 Impacted cerumen, bilateral: Secondary | ICD-10-CM | POA: Diagnosis not present

## 2022-02-28 DIAGNOSIS — Z6828 Body mass index (BMI) 28.0-28.9, adult: Secondary | ICD-10-CM | POA: Diagnosis not present

## 2022-02-28 DIAGNOSIS — J069 Acute upper respiratory infection, unspecified: Secondary | ICD-10-CM | POA: Diagnosis not present

## 2022-02-28 DIAGNOSIS — H8111 Benign paroxysmal vertigo, right ear: Secondary | ICD-10-CM | POA: Diagnosis not present

## 2022-04-12 DIAGNOSIS — H8111 Benign paroxysmal vertigo, right ear: Secondary | ICD-10-CM | POA: Diagnosis not present

## 2022-04-12 DIAGNOSIS — R972 Elevated prostate specific antigen [PSA]: Secondary | ICD-10-CM | POA: Diagnosis not present

## 2022-04-12 DIAGNOSIS — E7800 Pure hypercholesterolemia, unspecified: Secondary | ICD-10-CM | POA: Diagnosis not present

## 2022-04-12 DIAGNOSIS — Z6828 Body mass index (BMI) 28.0-28.9, adult: Secondary | ICD-10-CM | POA: Diagnosis not present

## 2022-04-12 DIAGNOSIS — Z0000 Encounter for general adult medical examination without abnormal findings: Secondary | ICD-10-CM | POA: Diagnosis not present

## 2022-04-12 DIAGNOSIS — D696 Thrombocytopenia, unspecified: Secondary | ICD-10-CM | POA: Diagnosis not present

## 2022-04-16 DIAGNOSIS — Z0100 Encounter for examination of eyes and vision without abnormal findings: Secondary | ICD-10-CM | POA: Diagnosis not present

## 2022-06-08 DIAGNOSIS — N400 Enlarged prostate without lower urinary tract symptoms: Secondary | ICD-10-CM | POA: Diagnosis not present

## 2022-06-08 DIAGNOSIS — R972 Elevated prostate specific antigen [PSA]: Secondary | ICD-10-CM | POA: Diagnosis not present

## 2022-07-27 DIAGNOSIS — L570 Actinic keratosis: Secondary | ICD-10-CM | POA: Diagnosis not present

## 2022-07-27 DIAGNOSIS — L738 Other specified follicular disorders: Secondary | ICD-10-CM | POA: Diagnosis not present

## 2022-07-27 DIAGNOSIS — L400 Psoriasis vulgaris: Secondary | ICD-10-CM | POA: Diagnosis not present

## 2022-07-27 DIAGNOSIS — D1801 Hemangioma of skin and subcutaneous tissue: Secondary | ICD-10-CM | POA: Diagnosis not present

## 2022-07-27 DIAGNOSIS — L821 Other seborrheic keratosis: Secondary | ICD-10-CM | POA: Diagnosis not present

## 2022-07-27 DIAGNOSIS — L280 Lichen simplex chronicus: Secondary | ICD-10-CM | POA: Diagnosis not present

## 2022-10-23 DIAGNOSIS — L400 Psoriasis vulgaris: Secondary | ICD-10-CM | POA: Diagnosis not present

## 2023-04-25 DIAGNOSIS — H52223 Regular astigmatism, bilateral: Secondary | ICD-10-CM | POA: Diagnosis not present

## 2023-04-30 DIAGNOSIS — R972 Elevated prostate specific antigen [PSA]: Secondary | ICD-10-CM | POA: Diagnosis not present

## 2023-04-30 DIAGNOSIS — E7800 Pure hypercholesterolemia, unspecified: Secondary | ICD-10-CM | POA: Diagnosis not present

## 2023-04-30 DIAGNOSIS — L4050 Arthropathic psoriasis, unspecified: Secondary | ICD-10-CM | POA: Diagnosis not present

## 2023-04-30 DIAGNOSIS — D696 Thrombocytopenia, unspecified: Secondary | ICD-10-CM | POA: Diagnosis not present

## 2023-04-30 DIAGNOSIS — Z0000 Encounter for general adult medical examination without abnormal findings: Secondary | ICD-10-CM | POA: Diagnosis not present

## 2023-04-30 DIAGNOSIS — Z6827 Body mass index (BMI) 27.0-27.9, adult: Secondary | ICD-10-CM | POA: Diagnosis not present

## 2023-04-30 DIAGNOSIS — L409 Psoriasis, unspecified: Secondary | ICD-10-CM | POA: Diagnosis not present

## 2023-07-30 DIAGNOSIS — L821 Other seborrheic keratosis: Secondary | ICD-10-CM | POA: Diagnosis not present

## 2023-07-30 DIAGNOSIS — L400 Psoriasis vulgaris: Secondary | ICD-10-CM | POA: Diagnosis not present

## 2024-01-28 DIAGNOSIS — L400 Psoriasis vulgaris: Secondary | ICD-10-CM | POA: Diagnosis not present

## 2024-01-28 DIAGNOSIS — D485 Neoplasm of uncertain behavior of skin: Secondary | ICD-10-CM | POA: Diagnosis not present

## 2024-01-28 DIAGNOSIS — D692 Other nonthrombocytopenic purpura: Secondary | ICD-10-CM | POA: Diagnosis not present

## 2024-01-28 DIAGNOSIS — L821 Other seborrheic keratosis: Secondary | ICD-10-CM | POA: Diagnosis not present
